# Patient Record
Sex: Female | Born: 1990 | Race: Black or African American | Hispanic: No | Marital: Married | Smoking: Never smoker
Health system: Southern US, Community
[De-identification: ages and names within clinical notes are randomized; demographics above are authoritative.]

## PROBLEM LIST (undated history)

## (undated) DIAGNOSIS — J45909 Unspecified asthma, uncomplicated: Secondary | ICD-10-CM

---

## 2015-07-02 ENCOUNTER — Ambulatory Visit (INDEPENDENT_AMBULATORY_CARE_PROVIDER_SITE_OTHER): Payer: Medicaid Other

## 2015-07-02 DIAGNOSIS — Z3490 Encounter for supervision of normal pregnancy, unspecified, unspecified trimester: Secondary | ICD-10-CM

## 2015-07-02 DIAGNOSIS — Z3201 Encounter for pregnancy test, result positive: Secondary | ICD-10-CM

## 2015-07-02 LAB — POCT PREGNANCY, URINE: Preg Test, Ur: POSITIVE — AB

## 2015-07-02 NOTE — Progress Notes (Signed)
Pt presented today for a pregnancy test. Test confirms she is currentty pregnant. Pt wishes to start care at our office.She was advised to start taking prenatal vitamins.

## 2015-07-12 ENCOUNTER — Emergency Department (HOSPITAL_COMMUNITY)
Admission: EM | Admit: 2015-07-12 | Discharge: 2015-07-12 | Disposition: A | Discharge status: Home / Self Care | Payer: Medicaid Other | Attending: Emergency Medicine | Admitting: Emergency Medicine

## 2015-07-12 ENCOUNTER — Emergency Department (HOSPITAL_COMMUNITY): Payer: Medicaid Other

## 2015-07-12 ENCOUNTER — Encounter (HOSPITAL_COMMUNITY): Payer: Self-pay | Admitting: Emergency Medicine

## 2015-07-12 DIAGNOSIS — J45909 Unspecified asthma, uncomplicated: Secondary | ICD-10-CM | POA: Insufficient documentation

## 2015-07-12 DIAGNOSIS — O26893 Other specified pregnancy related conditions, third trimester: Secondary | ICD-10-CM | POA: Insufficient documentation

## 2015-07-12 DIAGNOSIS — Y999 Unspecified external cause status: Secondary | ICD-10-CM | POA: Insufficient documentation

## 2015-07-12 DIAGNOSIS — S9032XA Contusion of left foot, initial encounter: Secondary | ICD-10-CM | POA: Diagnosis not present

## 2015-07-12 DIAGNOSIS — Y939 Activity, unspecified: Secondary | ICD-10-CM | POA: Diagnosis not present

## 2015-07-12 DIAGNOSIS — Z3A29 29 weeks gestation of pregnancy: Secondary | ICD-10-CM | POA: Diagnosis not present

## 2015-07-12 DIAGNOSIS — Y929 Unspecified place or not applicable: Secondary | ICD-10-CM | POA: Diagnosis not present

## 2015-07-12 DIAGNOSIS — W2203XA Walked into furniture, initial encounter: Secondary | ICD-10-CM | POA: Diagnosis not present

## 2015-07-12 DIAGNOSIS — M79672 Pain in left foot: Secondary | ICD-10-CM | POA: Diagnosis present

## 2015-07-12 HISTORY — DX: Unspecified asthma, uncomplicated: J45.909

## 2015-07-12 NOTE — ED Notes (Signed)
Patient transported to X-ray 

## 2015-07-12 NOTE — Discharge Instructions (Signed)
Please read and follow all provided instructions.  Your diagnoses today include:  1. Contusion of left foot, initial encounter     Tests performed today include:  An x-ray of the affected area - does NOT show any broken bones  Vital signs. See below for your results today.   Medications prescribed:  Tylenol if needed.   Take any prescribed medications only as directed.  Home care instructions:   Follow any educational materials contained in this packet  Follow R.I.C.E. Protocol:  R - rest your injury   I  - use ice on injury without applying directly to skin  C - compress injury with bandage or splint  E - elevate the injury as much as possible  Follow-up instructions: Please follow-up with your primary care provider if you continue to have significant pain in 1 week. In this case you may have a more severe injury that requires further care.   Return instructions:   Please return if your toes or feet are numb or tingling, appear gray or blue, or you have severe pain (also elevate the leg and loosen splint or wrap if you were given one)  Please return to the Emergency Department if you experience worsening symptoms.   Please return if you have any other emergent concerns.  Additional Information:  Your vital signs today were: BP 149/87 mmHg   Pulse 99   Temp(Src) 99.1 F (37.3 C) (Oral)   Resp 18   Ht 5' 3.5" (1.613 m)   Wt 130.182 kg   BMI 50.04 kg/m2   SpO2 100%   LMP  If your blood pressure (BP) was elevated above 135/85 this visit, please have this repeated by your doctor within one month. -------------- If prescribed crutches for your injury: use crutches with non-weight bearing for the first few days. Then, you may walk as the pain allows, or as instructed. Start gradually with weight bearing on the affected side. Once you can walk pain free, then try jogging. When you can run forwards, then you can try moving side-to-side. If you cannot walk without crutches in  one week, you need a re-check. --------------

## 2015-07-12 NOTE — ED Notes (Addendum)
Patient complaining of left foot pain and swelling. Patient stumped foot on coffee table. Patient states foot is swollen and in pain. Patients left foot is swollen. Patient is [redacted] weeks pregnant.

## 2015-07-12 NOTE — ED Notes (Signed)
PT REFUSED ICE PACK.

## 2015-07-12 NOTE — ED Provider Notes (Signed)
CSN: [id number]     Arrival date & time 07/12/15  2206 History  By signing my name below, I, Bridgette HabermannMaria Tan, attest that this documentation has been prepared under the direction and in the presence of Renne CriglerJoshua Jacen Carlini, PA-C. Electronically Signed: Bridgette HabermannMaria Tan, ED Scribe. 07/12/2015. 11:05 PM.   Chief Complaint  Patient presents with  . Foot Injury   The history is provided by the patient. No language interpreter was used.   HPI Comments: Monique Wilkins is a 25 y.o. female who is currently 7629 weeks pregnant presents to the Emergency Department complaining of sudden onset, constant left food pain onset this morning at 11:30. Pt notes that her foot is swollen and numb. Pt states that she struck her foot against the coffee table. Pain is exacerbated with movement and patient is not able to bear weight on the foot. Pt has not tried to alleviate the pain. Patient denies any additional injuries.  Past Medical History  Diagnosis Date  . Asthma   . Cesarean delivery delivered    History reviewed. No pertinent past surgical history. History reviewed. No pertinent family history. Social History  Substance Use Topics  . Smoking status: Never Smoker   . Smokeless tobacco: Never Used  . Alcohol Use: No   OB History    Gravida Para Term Preterm AB TAB SAB Ectopic Multiple Living   4 2 2       2      Review of Systems  Constitutional: Negative for fever and activity change.  Respiratory: Negative for shortness of breath.   Gastrointestinal: Negative for nausea and vomiting.  Musculoskeletal: Positive for arthralgias and gait problem. Negative for back pain, joint swelling and neck pain.  Skin: Negative for wound.  Neurological: Negative for weakness and numbness.   Allergies  Review of patient's allergies indicates no known allergies.  Home Medications   Prior to Admission medications   Not on File   BP 149/87 mmHg  Pulse 99  Temp(Src) 99.1 F (37.3 C) (Oral)  Resp 18  Ht 5' 3.5"  (1.613 m)  Wt 287 lb (130.182 kg)  BMI 50.04 kg/m2  SpO2 100%  LMP    Physical Exam  Constitutional: She appears well-developed and well-nourished.  HENT:  Head: Normocephalic and atraumatic.  Eyes: Conjunctivae are normal.  Neck: Normal range of motion. Neck supple.  Cardiovascular: Exam reveals no decreased pulses.   Musculoskeletal: She exhibits tenderness. She exhibits no edema.       Left ankle: Normal.       Left lower leg: Normal.       Left foot: There is tenderness. There is normal range of motion and no bony tenderness.       Feet:  Neurological: She is alert. No sensory deficit.  Motor, sensation, and vascular distal to the injury is fully intact.   Skin: Skin is warm and dry.  Psychiatric: She has a normal mood and affect.  Nursing note and vitals reviewed.   ED Course  Procedures  DIAGNOSTIC STUDIES: Oxygen Saturation is 100% on RA, normal by my interpretation.    COORDINATION OF CARE: 11:03 PM Discussed treatment plan with pt at bedside which includes crutches, ACE wrap, and pt agreed to plan.  Imaging Review Dg Foot Complete Left  07/12/2015  CLINICAL DATA:  25 year old female with left foot injury or EXAM: LEFT FOOT - COMPLETE 3+ VIEW COMPARISON:  None. FINDINGS: There is no evidence of fracture or dislocation. There is no evidence of arthropathy or other  focal bone abnormality. Soft tissues are unremarkable. IMPRESSION: Negative. Electronically Signed   By: Elgie CollardArash  Radparvar M.D.   On: 07/12/2015 23:03   I have personally reviewed and evaluated these images and lab results as part of my medical decision-making.  Vital signs reviewed and are as follows: Filed Vitals:   07/12/15 2213  BP: 149/87  Pulse: 99  Temp: 99.1 F (37.3 C)  Resp: 18   11:11 PM Patient was counseled on RICE protocol and told to rest injury, use ice for no longer than 15 minutes every hour, compress the area, and elevate above the level of their heart as much as possible to reduce  swelling. Questions answered. Patient verbalized understanding.     MDM   Final diagnoses:  Contusion of left foot, initial encounter   Contusion of foot. Imaging is negative. Foot is neurovascularly intact.   I personally performed the services described in this documentation, which was scribed in my presence. The recorded information has been reviewed and is accurate.    Renne CriglerJoshua Taleah Bellantoni, PA-C 07/12/15 2312  Leta BaptistEmily Roe Nguyen, MD 07/13/15 [phone]

## 2015-07-18 ENCOUNTER — Encounter (HOSPITAL_COMMUNITY): Payer: Self-pay | Admitting: *Deleted

## 2015-07-18 ENCOUNTER — Inpatient Hospital Stay (HOSPITAL_COMMUNITY)
Admission: AD | Admit: 2015-07-18 | Discharge: 2015-07-18 | Disposition: A | Discharge status: Home / Self Care | Payer: Medicaid Other | Source: Ambulatory Visit | Attending: Obstetrics & Gynecology | Admitting: Obstetrics & Gynecology

## 2015-07-18 ENCOUNTER — Inpatient Hospital Stay (HOSPITAL_COMMUNITY): Payer: Medicaid Other

## 2015-07-18 DIAGNOSIS — Z6841 Body Mass Index (BMI) 40.0 and over, adult: Secondary | ICD-10-CM | POA: Diagnosis not present

## 2015-07-18 DIAGNOSIS — O0990 Supervision of high risk pregnancy, unspecified, unspecified trimester: Secondary | ICD-10-CM

## 2015-07-18 DIAGNOSIS — O4693 Antepartum hemorrhage, unspecified, third trimester: Secondary | ICD-10-CM

## 2015-07-18 DIAGNOSIS — O0930 Supervision of pregnancy with insufficient antenatal care, unspecified trimester: Secondary | ICD-10-CM

## 2015-07-18 DIAGNOSIS — O34219 Maternal care for unspecified type scar from previous cesarean delivery: Secondary | ICD-10-CM | POA: Diagnosis present

## 2015-07-18 DIAGNOSIS — Z98891 History of uterine scar from previous surgery: Secondary | ICD-10-CM | POA: Diagnosis present

## 2015-07-18 DIAGNOSIS — R350 Frequency of micturition: Secondary | ICD-10-CM | POA: Diagnosis present

## 2015-07-18 DIAGNOSIS — O99213 Obesity complicating pregnancy, third trimester: Secondary | ICD-10-CM | POA: Insufficient documentation

## 2015-07-18 DIAGNOSIS — Z3A38 38 weeks gestation of pregnancy: Secondary | ICD-10-CM | POA: Diagnosis not present

## 2015-07-18 DIAGNOSIS — O0933 Supervision of pregnancy with insufficient antenatal care, third trimester: Secondary | ICD-10-CM

## 2015-07-18 DIAGNOSIS — Z3493 Encounter for supervision of normal pregnancy, unspecified, third trimester: Secondary | ICD-10-CM

## 2015-07-18 LAB — COMPREHENSIVE METABOLIC PANEL
ALBUMIN: 3.2 g/dL — AB (ref 3.5–5.0)
ALK PHOS: 114 U/L (ref 38–126)
ALT: 10 U/L — AB (ref 14–54)
AST: 13 U/L — ABNORMAL LOW (ref 15–41)
Anion gap: 10 (ref 5–15)
BUN: 11 mg/dL (ref 6–20)
CALCIUM: 9.2 mg/dL (ref 8.9–10.3)
CO2: 19 mmol/L — AB (ref 22–32)
CREATININE: 0.62 mg/dL (ref 0.44–1.00)
Chloride: 106 mmol/L (ref 101–111)
GFR calc Af Amer: >60 mL/min (ref >60)
GFR calc non Af Amer: >60 mL/min (ref >60)
GLUCOSE: 96 mg/dL (ref 65–99)
Potassium: 3.7 mmol/L (ref 3.5–5.1)
SODIUM: 135 mmol/L (ref 135–145)
Total Bilirubin: 0.6 mg/dL (ref 0.3–1.2)
Total Protein: 7.6 g/dL (ref 6.5–8.1)

## 2015-07-18 LAB — CBC
HEMATOCRIT: 31.9 % — AB (ref 36.0–46.0)
HEMOGLOBIN: 10.1 g/dL — AB (ref 12.0–15.0)
MCH: 22.1 pg — AB (ref 26.0–34.0)
MCHC: 31.7 g/dL (ref 30.0–36.0)
MCV: 69.7 fL — AB (ref 78.0–100.0)
Platelets: 216 10*3/uL (ref 150–400)
RBC: 4.58 MIL/uL (ref 3.87–5.11)
RDW: 16.7 % — ABNORMAL HIGH (ref 11.5–15.5)
WBC: 6.5 10*3/uL (ref 4.0–10.5)

## 2015-07-18 LAB — WET PREP, GENITAL
CLUE CELLS WET PREP: NONE SEEN
SPERM: NONE SEEN
Trich, Wet Prep: NONE SEEN
Yeast Wet Prep HPF POC: NONE SEEN

## 2015-07-18 LAB — RAPID URINE DRUG SCREEN, HOSP PERFORMED
AMPHETAMINES: NOT DETECTED
Barbiturates: NOT DETECTED
Benzodiazepines: NOT DETECTED
COCAINE: NOT DETECTED
OPIATES: NOT DETECTED
TETRAHYDROCANNABINOL: NOT DETECTED

## 2015-07-18 LAB — URINALYSIS, ROUTINE W REFLEX MICROSCOPIC
BILIRUBIN URINE: NEGATIVE
GLUCOSE, UA: NEGATIVE mg/dL
KETONES UR: NEGATIVE mg/dL
Nitrite: NEGATIVE
PH: 6.5 (ref 5.0–8.0)
PROTEIN: NEGATIVE mg/dL
Specific Gravity, Urine: <1.005 — ABNORMAL LOW (ref 1.005–1.030)

## 2015-07-18 LAB — TYPE AND SCREEN
ABO/RH(D): A POS
Antibody Screen: NEGATIVE

## 2015-07-18 LAB — ABO/RH: ABO/RH(D): A POS

## 2015-07-18 LAB — URINE MICROSCOPIC-ADD ON

## 2015-07-18 LAB — HEPATITIS B SURFACE ANTIGEN: Hepatitis B Surface Ag: NEGATIVE

## 2015-07-18 LAB — RPR: RPR: NONREACTIVE

## 2015-07-18 LAB — HIV ANTIBODY (ROUTINE TESTING W REFLEX): HIV Screen 4th Generation wRfx: NONREACTIVE

## 2015-07-18 NOTE — MAU Note (Addendum)
Urinating a lot last night and having a lot of pelvic pressure. Went to BR about 30mins ago and saw pink on tissue. [place].

## 2015-07-18 NOTE — Progress Notes (Signed)
Philipp DeputyKim Shaw CNM on unit and aware of pt's admission and status.

## 2015-07-18 NOTE — Progress Notes (Signed)
GBS obtained and sent.

## 2015-07-18 NOTE — MAU Provider Note (Signed)
History   [id number]   Chief Complaint  Patient presents with  . Urinary Frequency    HPI Monique Wilkins is a 25 y.o. female  G4P2002 at presumed 3645w2d IUP by uncertain LMP here with report of increased urinary frequency and pink tinge with wiping.  Also reports increased pelvic pressure.  Initial prenatal care appt scheduled with Dr. Adrian BlackwaterStinson on 07/23/15.        No LMP recorded. Patient is pregnant.  OB History  Gravida Para Term Preterm AB SAB TAB Ectopic Multiple Living  4 2 2  1 1    4     # Outcome Date GA Lbr Len/2nd Weight Sex Delivery Anes PTL Lv  4 Current           3 Term 06/03/13 2850w0d  8 lb 5 oz (3.771 kg) M CS-LTranv Spinal N Y  2 Term 06/12/11 8764w0d  7 lb 5 oz (3.317 kg) F CS-LTranv EPI N Y     Complications: Dysfunctional Labor  1 SAB               Past Medical History  Diagnosis Date  . Asthma   . Cesarean delivery delivered     No family history on file.  Social History   Social History  . Marital Status: Married    Spouse Name: N/A  . Number of Children: N/A  . Years of Education: N/A   Social History Main Topics  . Smoking status: Never Smoker   . Smokeless tobacco: Never Used  . Alcohol Use: No  . Drug Use: No  . Sexual Activity: Not on file   Other Topics Concern  . Not on file   Social History Narrative    No Known Allergies  No current facility-administered medications on file prior to encounter.   No current outpatient prescriptions on file prior to encounter.     Review of Systems  Constitutional: Negative for fever and chills.  Eyes: Negative for visual disturbance.  Gastrointestinal: Negative for abdominal pain.  Genitourinary: Positive for frequency and vaginal bleeding (spotting). Negative for dysuria, flank pain and pelvic pain.  Musculoskeletal: Positive for back pain.  Neurological: Negative for headaches.  All other systems reviewed and are negative.    Physical Exam   Filed Vitals:   07/18/15 0334  07/18/15 0516  BP: 141/72 116/73  Pulse: 89 101  Resp:  18  Height: 5' 3.5" (1.613 m)   Weight: 289 lb 12.8 oz (131.452 kg)     Physical Exam  Constitutional: She is oriented to person, place, and time. She appears well-developed and well-nourished. No distress.  HENT:  Head: Normocephalic.  Neck: Normal range of motion. Neck supple.  Cardiovascular: Normal rate, regular rhythm and normal heart sounds.   Respiratory: Effort normal and breath sounds normal. No respiratory distress.  GI: Soft. There is no tenderness.  Fundal height 37  Genitourinary: No bleeding in the vagina.  Musculoskeletal: Normal range of motion. She exhibits no edema.  Neurological: She is alert and oriented to person, place, and time.  Skin: Skin is warm and dry.   Dilation: Closed Effacement (%): Thick Cervical Position: Posterior Presentation: Vertex Exam by:: Margarita MailW. Wilkins, CNM  FHR 151, +accels Toco none  MAU Course  Procedures  MDM Ultrasound due to vaginal spotting of blood with hx of csection x 2 and  fundal height measuring 37 cm and no prior scan during pregnancy  Called by Herbert SetaHeather, recommended US change from limited to complete due to advanced  gestational age  Ultrasound; EGA 38.4 wks; nml, but limited anatomy; nml AFI; no previa or abruption identified  Assessment and Plan  25 y.o. [id number] at 6111w4d IUP Hx of Csection x 2 Reactive NST Obesity in Pregnancy No prenatal care   Plan: Discharge home Labor/Bleeding Precautions Staff will call with Csection date - Tuesday or Wednesday Prenatal labs pending   Monique Wilkins, CNM 07/18/2015 5:30 AM

## 2015-07-18 NOTE — Progress Notes (Signed)
Pt to us ambulatory

## 2015-07-18 NOTE — Progress Notes (Signed)
Margarita MailW Karim CNM in to discuss test results and discharge plan. Written and verbal d/c instructions given and understanding voiced.

## 2015-07-18 NOTE — Progress Notes (Signed)
efm reapplied when returned from u/s

## 2015-07-19 ENCOUNTER — Encounter: Payer: Self-pay | Admitting: Advanced Practice Midwife

## 2015-07-19 ENCOUNTER — Encounter (HOSPITAL_COMMUNITY): Payer: Self-pay

## 2015-07-19 ENCOUNTER — Inpatient Hospital Stay (EMERGENCY_DEPARTMENT_HOSPITAL)
Admission: AD | Admit: 2015-07-19 | Discharge: 2015-07-19 | Disposition: A | Discharge status: Home / Self Care | Payer: Medicaid Other | Source: Ambulatory Visit | Attending: Family Medicine | Admitting: Family Medicine

## 2015-07-19 DIAGNOSIS — O98313 Other infections with a predominantly sexual mode of transmission complicating pregnancy, third trimester: Secondary | ICD-10-CM | POA: Diagnosis not present

## 2015-07-19 DIAGNOSIS — O26899 Other specified pregnancy related conditions, unspecified trimester: Secondary | ICD-10-CM

## 2015-07-19 DIAGNOSIS — O34219 Maternal care for unspecified type scar from previous cesarean delivery: Secondary | ICD-10-CM

## 2015-07-19 DIAGNOSIS — A599 Trichomoniasis, unspecified: Secondary | ICD-10-CM

## 2015-07-19 DIAGNOSIS — Z3A38 38 weeks gestation of pregnancy: Secondary | ICD-10-CM

## 2015-07-19 DIAGNOSIS — R109 Unspecified abdominal pain: Secondary | ICD-10-CM

## 2015-07-19 DIAGNOSIS — O99820 Streptococcus B carrier state complicating pregnancy: Secondary | ICD-10-CM | POA: Insufficient documentation

## 2015-07-19 LAB — URINE MICROSCOPIC-ADD ON

## 2015-07-19 LAB — URINALYSIS, ROUTINE W REFLEX MICROSCOPIC
BILIRUBIN URINE: NEGATIVE
GLUCOSE, UA: NEGATIVE mg/dL
Ketones, ur: 15 mg/dL — AB
Nitrite: NEGATIVE
PH: 6.5 (ref 5.0–8.0)
Protein, ur: NEGATIVE mg/dL
SPECIFIC GRAVITY, URINE: 1.015 (ref 1.005–1.030)

## 2015-07-19 LAB — RUBELLA SCREEN

## 2015-07-19 LAB — CULTURE, BETA STREP (GROUP B ONLY)

## 2015-07-19 MED ORDER — METRONIDAZOLE 500 MG PO TABS
2000.0000 mg | ORAL_TABLET | Freq: Once | ORAL | Status: AC
  Administered 2015-07-19: 2000 mg via ORAL
  Filled 2015-07-19: qty 4

## 2015-07-19 NOTE — MAU Note (Signed)
Pelvic pressure and lower abd pain and low back pain coming and going since Saturday morning.  Couldn't sleep or nothing.  No leaking.  No bleeding. Closed last exam on Saturday. Baby moving well.

## 2015-07-19 NOTE — Discharge Instructions (Signed)
Trichomoniasis Trichomoniasis is an infection caused by an organism called Trichomonas. The infection can affect both women and men. In women, the outer female genitalia and the vagina are affected. In men, the penis is mainly affected, but the prostate and other reproductive organs can also be involved. Trichomoniasis is a sexually transmitted infection (STI) and is most often passed to another person through sexual contact.  RISK FACTORS  Having unprotected sexual intercourse.  Having sexual intercourse with an infected partner. SIGNS AND SYMPTOMS  Symptoms of trichomoniasis in women include:  Abnormal gray-green frothy vaginal discharge.  Itching and irritation of the vagina.  Itching and irritation of the area outside the vagina. Symptoms of trichomoniasis in men include:   Penile discharge with or without pain.  Pain during urination. This results from inflammation of the urethra. DIAGNOSIS  Trichomoniasis may be found during a Pap test or physical exam. Your health care provider may use one of the following methods to help diagnose this infection:  Testing the pH of the vagina with a test tape.  Using a vaginal swab test that checks for the Trichomonas organism. A test is available that provides results within a few minutes.  Examining a urine sample.  Testing vaginal secretions. Your health care provider may test you for other STIs, including HIV. TREATMENT   You may be given medicine to fight the infection. Women should inform their health care provider if they could be or are pregnant. Some medicines used to treat the infection should not be taken during pregnancy.  Your health care provider may recommend over-the-counter medicines or creams to decrease itching or irritation.  Your sexual partner will need to be treated if infected.  Your health care provider may test you for infection again 3 months after treatment. HOME CARE INSTRUCTIONS   Take medicines only as  directed by your health care provider.  Take over-the-counter medicine for itching or irritation as directed by your health care provider.  Do not have sexual intercourse while you have the infection.  Women should not douche or wear tampons while they have the infection.  Discuss your infection with your partner. Your partner may have gotten the infection from you, or you may have gotten it from your partner.  Have your sex partner get examined and treated if necessary.  Practice safe, informed, and protected sex.  See your health care provider for other STI testing. SEEK MEDICAL CARE IF:   You still have symptoms after you finish your medicine.  You develop abdominal pain.  You have pain when you urinate.  You have bleeding after sexual intercourse.  You develop a rash.  Your medicine makes you sick or makes you throw up (vomit). MAKE SURE YOU:  Understand these instructions.  Will watch your condition.  Will get help right away if you are not doing well or get worse.   This information is not intended to replace advice given to you by your health care provider. Make sure you discuss any questions you have with your health care provider.   Document Released: 06/29/2000 Document Revised: 01/24/2014 Document Reviewed: 10/15/2012 Elsevier Interactive Patient Education 2016 Else[employer] Inc[employer]  

## 2015-07-19 NOTE — MAU Provider Note (Signed)
History     CSN: [id number]  Arrival date and time: 07/19/15 [id number]   First Provider Initiated Contact with Patient 07/19/15 [phone]      Chief Complaint  Patient presents with  . Abdominal Pain   HPI [name] is a 25 y.o. [id number] at 5776w5d who presents to MAU today with complaint of pelvic pressure. The patient was seen yesterday and checked. Cervix was closed, thick yesterday. She had some spotting at that time which has resolved. She denies bleeding, discharge, LOF, regular contractions or UTI symptoms today. She has not had any prenatal care for this pregnancy. She states this is because she just got her Medicaid. She was seen by [facility] 2 weeks ago and given an estimated due date. She is supposed to be contacted for repeat C/S this week. She reports good fetal movement.   OB History    Gravida Para Term Preterm AB TAB SAB Ectopic Multiple Living   4 2 2  1  1   2       Past Medical History  Diagnosis Date  . Asthma   . Cesarean delivery delivered     Past Surgical History  Procedure Laterality Date  . Cesarean section      Family History  Problem Relation Age of Onset  . Alcohol abuse Neg Hx     Social History  Substance Use Topics  . Smoking status: Never Smoker   . Smokeless tobacco: Never Used  . Alcohol Use: No    Allergies: No Known Allergies  Prescriptions prior to admission  Medication Sig Dispense Refill Last Dose  . acetaminophen (TYLENOL) 325 MG tablet Take 650 mg by mouth every 6 (six) hours as needed.   Past Week at Unknown time    Review of Systems  Constitutional: Negative for fever and malaise/fatigue.  Gastrointestinal: Positive for abdominal pain. Negative for nausea, vomiting, diarrhea and constipation.  Genitourinary: Negative for dysuria, urgency and frequency.       Neg - vaginal bleeding, discharge, LOF   Physical Exam   Blood pressure 126/67, pulse 107, temperature 97.9 F (36.6 C), temperature source Oral, resp. rate 18,  SpO2 99 %.  Physical Exam  Nursing note and vitals reviewed. Constitutional: She is oriented to person, place, and time. She appears well-developed and well-nourished. No distress.  HENT:  Head: Normocephalic and atraumatic.  Cardiovascular: Normal rate.   Respiratory: Effort normal.  GI: Soft. She exhibits no distension and no mass. There is no tenderness. There is no rebound and no guarding.  Neurological: She is alert and oriented to person, place, and time.  Skin: Skin is warm and dry. No erythema.  Psychiatric: She has a normal mood and affect.  Dilation: Closed Effacement (%): Thick Cervical Position: Posterior Station: -3 Presentation: Undeterminable Exam by:: Remigio EisenmengerBenji Stanley RN  Results for orders placed or performed during the hospital encounter of 07/19/15 (from the past 24 hour(s))  Urinalysis, Routine w reflex microscopic (not at [facility])     Status: Abnormal   Collection Time: 07/19/15  6:00 AM  Result Value Ref Range   Color, Urine YELLOW YELLOW   APPearance CLEAR CLEAR   Specific Gravity, Urine 1.015 1.005 - 1.030   pH 6.5 5.0 - 8.0   Glucose, UA NEGATIVE NEGATIVE mg/dL   Hgb urine dipstick SMALL (A) NEGATIVE   Bilirubin Urine NEGATIVE NEGATIVE   Ketones, ur 15 (A) NEGATIVE mg/dL   Protein, ur NEGATIVE NEGATIVE mg/dL   Nitrite NEGATIVE NEGATIVE   Leukocytes, UA  SMALL (A) NEGATIVE  Urine microscopic-add on     Status: Abnormal   Collection Time: 07/19/15  6:00 AM  Result Value Ref Range   Squamous Epithelial / LPF 0-5 (A) NONE SEEN   WBC, UA 0-5 0 - 5 WBC/hpf   RBC / HPF 0-5 0 - 5 RBC/hpf   Bacteria, UA RARE (A) NONE SEEN   Trichomonas, UA PRESENT    Fetal Monitoring: Baseline: 140 bpm Variability: moderate Accelerations: 15 x 15 Decelerations: none Contractions: few, irregular with mild UI  MAU Course  Procedures None  MDM UA today shows + trichomonas 2 G Flagyl given in MAU today   Assessment and Plan  A: SIUP at  2735w5d Trichomonas  P: Discharge home Treated in MAU, partner treatment advised. Patient voiced understanding and states he will go to his MD even after expedited therapy was offered.  Labor precautions discussed Patient advised to follow-up with [facility] as planned for prenatal care/ c-section Patient may return to MAU as needed or if her condition were to change or worsen   Marny LowensteinJulie N Giancarlo Askren, PA-C  07/19/2015, 7:20 AM

## 2015-07-20 ENCOUNTER — Encounter (HOSPITAL_COMMUNITY): Payer: Self-pay | Admitting: *Deleted

## 2015-07-20 ENCOUNTER — Inpatient Hospital Stay (HOSPITAL_COMMUNITY): Payer: Medicaid Other | Admitting: Anesthesiology

## 2015-07-20 ENCOUNTER — Inpatient Hospital Stay (HOSPITAL_COMMUNITY): Payer: Medicaid Other

## 2015-07-20 ENCOUNTER — Inpatient Hospital Stay (HOSPITAL_COMMUNITY)
Admission: AD | Admit: 2015-07-20 | Discharge: 2015-07-22 | DRG: 765 | Disposition: A | Discharge status: Home / Self Care | Payer: Medicaid Other | Source: Ambulatory Visit | Attending: Obstetrics and Gynecology | Admitting: Obstetrics and Gynecology

## 2015-07-20 ENCOUNTER — Encounter (HOSPITAL_COMMUNITY)
Admission: AD | Disposition: A | Discharge status: Home / Self Care | Payer: Self-pay | Source: Ambulatory Visit | Attending: Obstetrics and Gynecology

## 2015-07-20 DIAGNOSIS — O9989 Other specified diseases and conditions complicating pregnancy, childbirth and the puerperium: Secondary | ICD-10-CM

## 2015-07-20 DIAGNOSIS — O134 Gestational [pregnancy-induced] hypertension without significant proteinuria, complicating childbirth: Secondary | ICD-10-CM | POA: Diagnosis present

## 2015-07-20 DIAGNOSIS — Z98891 History of uterine scar from previous surgery: Secondary | ICD-10-CM

## 2015-07-20 DIAGNOSIS — IMO0002 Reserved for concepts with insufficient information to code with codable children: Secondary | ICD-10-CM

## 2015-07-20 DIAGNOSIS — O0930 Supervision of pregnancy with insufficient antenatal care, unspecified trimester: Secondary | ICD-10-CM

## 2015-07-20 DIAGNOSIS — O98313 Other infections with a predominantly sexual mode of transmission complicating pregnancy, third trimester: Secondary | ICD-10-CM | POA: Diagnosis not present

## 2015-07-20 DIAGNOSIS — O34211 Maternal care for low transverse scar from previous cesarean delivery: Secondary | ICD-10-CM | POA: Diagnosis present

## 2015-07-20 DIAGNOSIS — O09899 Supervision of other high risk pregnancies, unspecified trimester: Secondary | ICD-10-CM

## 2015-07-20 DIAGNOSIS — O3413 Maternal care for benign tumor of corpus uteri, third trimester: Secondary | ICD-10-CM | POA: Diagnosis present

## 2015-07-20 DIAGNOSIS — Z6841 Body Mass Index (BMI) 40.0 and over, adult: Secondary | ICD-10-CM | POA: Diagnosis not present

## 2015-07-20 DIAGNOSIS — O139 Gestational [pregnancy-induced] hypertension without significant proteinuria, unspecified trimester: Secondary | ICD-10-CM

## 2015-07-20 DIAGNOSIS — Z3A38 38 weeks gestation of pregnancy: Secondary | ICD-10-CM

## 2015-07-20 DIAGNOSIS — Z3490 Encounter for supervision of normal pregnancy, unspecified, unspecified trimester: Secondary | ICD-10-CM

## 2015-07-20 DIAGNOSIS — O135 Gestational [pregnancy-induced] hypertension without significant proteinuria, complicating the puerperium: Secondary | ICD-10-CM

## 2015-07-20 DIAGNOSIS — Z23 Encounter for immunization: Secondary | ICD-10-CM | POA: Diagnosis not present

## 2015-07-20 DIAGNOSIS — Z8759 Personal history of other complications of pregnancy, childbirth and the puerperium: Secondary | ICD-10-CM

## 2015-07-20 DIAGNOSIS — Z283 Underimmunization status: Secondary | ICD-10-CM

## 2015-07-20 DIAGNOSIS — A599 Trichomoniasis, unspecified: Secondary | ICD-10-CM | POA: Diagnosis not present

## 2015-07-20 LAB — CBC
HEMATOCRIT: 30.7 % — AB (ref 36.0–46.0)
Hemoglobin: 9.7 g/dL — ABNORMAL LOW (ref 12.0–15.0)
MCH: 22.1 pg — ABNORMAL LOW (ref 26.0–34.0)
MCHC: 31.6 g/dL (ref 30.0–36.0)
MCV: 70.1 fL — ABNORMAL LOW (ref 78.0–100.0)
PLATELETS: 191 10*3/uL (ref 150–400)
RBC: 4.38 MIL/uL (ref 3.87–5.11)
RDW: 16.7 % — AB (ref 11.5–15.5)
WBC: 6.9 10*3/uL (ref 4.0–10.5)

## 2015-07-20 LAB — COMPREHENSIVE METABOLIC PANEL
ALBUMIN: 2.9 g/dL — AB (ref 3.5–5.0)
ALK PHOS: 100 U/L (ref 38–126)
ALT: 8 U/L — AB (ref 14–54)
AST: 12 U/L — AB (ref 15–41)
Anion gap: 8 (ref 5–15)
BILIRUBIN TOTAL: 0.7 mg/dL (ref 0.3–1.2)
BUN: 9 mg/dL (ref 6–20)
CO2: 22 mmol/L (ref 22–32)
CREATININE: 0.58 mg/dL (ref 0.44–1.00)
Calcium: 8.9 mg/dL (ref 8.9–10.3)
Chloride: 105 mmol/L (ref 101–111)
GFR calc Af Amer: >60 mL/min (ref >60)
GLUCOSE: 89 mg/dL (ref 65–99)
POTASSIUM: 4.2 mmol/L (ref 3.5–5.1)
Sodium: 135 mmol/L (ref 135–145)
TOTAL PROTEIN: 6.5 g/dL (ref 6.5–8.1)

## 2015-07-20 LAB — TYPE AND SCREEN
ABO/RH(D): A POS
Antibody Screen: NEGATIVE

## 2015-07-20 LAB — PROTEIN / CREATININE RATIO, URINE
Creatinine, Urine: 64 mg/dL
Protein Creatinine Ratio: 0.14 mg/mg{Cre} (ref 0.00–0.15)
Total Protein, Urine: 9 mg/dL

## 2015-07-20 LAB — GC/CHLAMYDIA PROBE AMP (~~LOC~~) NOT AT ARMC
Chlamydia: NEGATIVE
Neisseria Gonorrhea: NEGATIVE

## 2015-07-20 SURGERY — Surgical Case
Anesthesia: Spinal

## 2015-07-20 MED ORDER — SIMETHICONE 80 MG PO CHEW: 80.0000 mg | CHEWABLE_TABLET | ORAL | Status: DC | PRN

## 2015-07-20 MED ORDER — DIPHENHYDRAMINE HCL 50 MG/ML IJ SOLN: 12.5000 mg | INTRAMUSCULAR | Status: DC | PRN

## 2015-07-20 MED ORDER — DIPHENHYDRAMINE HCL 25 MG PO CAPS: 25.0000 mg | ORAL_CAPSULE | ORAL | Status: DC | PRN

## 2015-07-20 MED ORDER — FAMOTIDINE IN NACL 20-0.9 MG/50ML-% IV SOLN
20.0000 mg | Freq: Once | INTRAVENOUS | Status: AC
  Administered 2015-07-20: 20 mg via INTRAVENOUS
  Filled 2015-07-20: qty 50

## 2015-07-20 MED ORDER — DEXTROSE 5 % IV SOLN
3.0000 g | INTRAVENOUS | Status: AC
  Administered 2015-07-20: 3 g via INTRAVENOUS
  Filled 2015-07-20: qty 3000

## 2015-07-20 MED ORDER — SCOPOLAMINE 1 MG/3DAYS TD PT72
1.0000 | MEDICATED_PATCH | TRANSDERMAL | Status: DC
  Administered 2015-07-20: 1.5 mg via TRANSDERMAL
  Filled 2015-07-20: qty 1

## 2015-07-20 MED ORDER — NALOXONE HCL 0.4 MG/ML IJ SOLN: 0.4000 mg | INTRAMUSCULAR | Status: DC | PRN

## 2015-07-20 MED ORDER — BUPIVACAINE IN DEXTROSE 0.75-8.25 % IT SOLN
INTRATHECAL | Status: DC | PRN
  Administered 2015-07-20: 1.5 mL via INTRATHECAL

## 2015-07-20 MED ORDER — WITCH HAZEL-GLYCERIN EX PADS
1.0000 | MEDICATED_PAD | CUTANEOUS | Status: DC | PRN
Start: 2015-07-20 — End: 2015-07-22

## 2015-07-20 MED ORDER — SIMETHICONE 80 MG PO CHEW
80.0000 mg | CHEWABLE_TABLET | Freq: Three times a day (TID) | ORAL | Status: DC
  Administered 2015-07-21 – 2015-07-22 (×4): 80 mg via ORAL
  Filled 2015-07-20 (×4): qty 1

## 2015-07-20 MED ORDER — NALOXONE HCL 2 MG/2ML IJ SOSY: 1.0000 ug/kg/h | PREFILLED_SYRINGE | INTRAVENOUS | Status: DC | PRN

## 2015-07-20 MED ORDER — ONDANSETRON HCL 4 MG/2ML IJ SOLN: 4.0000 mg | Freq: Three times a day (TID) | INTRAMUSCULAR | Status: DC | PRN

## 2015-07-20 MED ORDER — MEPERIDINE HCL 25 MG/ML IJ SOLN: INTRAMUSCULAR | Status: DC | PRN

## 2015-07-20 MED ORDER — NALBUPHINE HCL 10 MG/ML IJ SOLN: 5.0000 mg | Freq: Once | INTRAMUSCULAR | Status: DC | PRN

## 2015-07-20 MED ORDER — MENTHOL 3 MG MT LOZG: 1.0000 | LOZENGE | OROMUCOSAL | Status: DC | PRN

## 2015-07-20 MED ORDER — SIMETHICONE 80 MG PO CHEW
80.0000 mg | CHEWABLE_TABLET | ORAL | Status: DC
  Administered 2015-07-20 – 2015-07-22 (×2): 80 mg via ORAL
  Filled 2015-07-20 (×2): qty 1

## 2015-07-20 MED ORDER — LACTATED RINGERS IV BOLUS (SEPSIS): 1000.0000 mL | Freq: Once | INTRAVENOUS | Status: DC

## 2015-07-20 MED ORDER — NALBUPHINE HCL 10 MG/ML IJ SOLN: 5.0000 mg | INTRAMUSCULAR | Status: DC | PRN

## 2015-07-20 MED ORDER — OXYTOCIN 10 UNIT/ML IJ SOLN
INTRAMUSCULAR | Status: AC
  Filled 2015-07-20: qty 4

## 2015-07-20 MED ORDER — LACTATED RINGERS IV BOLUS (SEPSIS)
1000.0000 mL | Freq: Once | INTRAVENOUS | Status: AC
  Administered 2015-07-20 (×2): 1000 mL via INTRAVENOUS

## 2015-07-20 MED ORDER — ONDANSETRON HCL 4 MG/2ML IJ SOLN
INTRAMUSCULAR | Status: DC | PRN
  Administered 2015-07-20: 4 mg via INTRAVENOUS

## 2015-07-20 MED ORDER — OXYTOCIN 10 UNIT/ML IJ SOLN
40.0000 [IU] | INTRAMUSCULAR | Status: DC | PRN
  Administered 2015-07-20: 40 [IU] via INTRAVENOUS

## 2015-07-20 MED ORDER — DIPHENHYDRAMINE HCL 25 MG PO CAPS: 25.0000 mg | ORAL_CAPSULE | Freq: Four times a day (QID) | ORAL | Status: DC | PRN

## 2015-07-20 MED ORDER — KETOROLAC TROMETHAMINE 30 MG/ML IJ SOLN
30.0000 mg | Freq: Four times a day (QID) | INTRAMUSCULAR | Status: AC | PRN
  Administered 2015-07-20: 30 mg via INTRAMUSCULAR

## 2015-07-20 MED ORDER — MORPHINE SULFATE (PF) 0.5 MG/ML IJ SOLN
INTRAMUSCULAR | Status: AC
  Filled 2015-07-20: qty 10

## 2015-07-20 MED ORDER — KETOROLAC TROMETHAMINE 30 MG/ML IJ SOLN
INTRAMUSCULAR | Status: AC
  Filled 2015-07-20: qty 1

## 2015-07-20 MED ORDER — LACTATED RINGERS IV SOLN
INTRAVENOUS | Status: DC | PRN
  Administered 2015-07-20: 12:00:00 via INTRAVENOUS

## 2015-07-20 MED ORDER — OXYTOCIN 40 UNITS IN LACTATED RINGERS INFUSION - SIMPLE MED: 2.5000 [IU]/h | INTRAVENOUS | Status: AC

## 2015-07-20 MED ORDER — SODIUM CHLORIDE 0.9% FLUSH: 3.0000 mL | INTRAVENOUS | Status: DC | PRN

## 2015-07-20 MED ORDER — TETANUS-DIPHTH-ACELL PERTUSSIS 5-2.5-18.5 LF-MCG/0.5 IM SUSP: 0.5000 mL | Freq: Once | INTRAMUSCULAR | Status: DC

## 2015-07-20 MED ORDER — SODIUM CHLORIDE 0.9 % IR SOLN
Status: DC | PRN
Start: 2015-07-20 — End: 2015-07-20
  Administered 2015-07-20: 1000 mL

## 2015-07-20 MED ORDER — FENTANYL CITRATE (PF) 100 MCG/2ML IJ SOLN
INTRAMUSCULAR | Status: DC | PRN
  Administered 2015-07-20: 20 ug via INTRATHECAL
  Administered 2015-07-20: 80 ug via INTRAVENOUS

## 2015-07-20 MED ORDER — FENTANYL CITRATE (PF) 100 MCG/2ML IJ SOLN
INTRAMUSCULAR | Status: AC
  Filled 2015-07-20: qty 2

## 2015-07-20 MED ORDER — LACTATED RINGERS IV SOLN: INTRAVENOUS | Status: DC

## 2015-07-20 MED ORDER — SOD CITRATE-CITRIC ACID 500-334 MG/5ML PO SOLN
30.0000 mL | Freq: Once | ORAL | Status: AC
  Administered 2015-07-20: 30 mL via ORAL
  Filled 2015-07-20: qty 15

## 2015-07-20 MED ORDER — MORPHINE SULFATE (PF) 0.5 MG/ML IJ SOLN
INTRAMUSCULAR | Status: DC | PRN
  Administered 2015-07-20: .2 mg via INTRATHECAL

## 2015-07-20 MED ORDER — SENNOSIDES-DOCUSATE SODIUM 8.6-50 MG PO TABS
2.0000 | ORAL_TABLET | ORAL | Status: DC
  Administered 2015-07-20 – 2015-07-22 (×2): 2 via ORAL
  Filled 2015-07-20 (×2): qty 2

## 2015-07-20 MED ORDER — LACTATED RINGERS IV SOLN
INTRAVENOUS | Status: DC
  Administered 2015-07-20 (×2): via INTRAVENOUS

## 2015-07-20 MED ORDER — KETOROLAC TROMETHAMINE 30 MG/ML IJ SOLN: 30.0000 mg | Freq: Four times a day (QID) | INTRAMUSCULAR | Status: AC | PRN

## 2015-07-20 MED ORDER — PHENYLEPHRINE HCL 10 MG/ML IJ SOLN
INTRAMUSCULAR | Status: DC | PRN
  Administered 2015-07-20: 40 ug via INTRAVENOUS

## 2015-07-20 MED ORDER — OXYCODONE HCL 5 MG PO TABS
5.0000 mg | ORAL_TABLET | ORAL | Status: DC | PRN
  Administered 2015-07-21 – 2015-07-22 (×4): 5 mg via ORAL
  Filled 2015-07-20 (×3): qty 1

## 2015-07-20 MED ORDER — COCONUT OIL OIL
1.0000 | TOPICAL_OIL | Status: DC | PRN
Start: 2015-07-20 — End: 2015-07-22

## 2015-07-20 MED ORDER — DIBUCAINE 1 % RE OINT: 1.0000 "application " | TOPICAL_OINTMENT | RECTAL | Status: DC | PRN

## 2015-07-20 MED ORDER — OXYCODONE HCL 5 MG PO TABS
10.0000 mg | ORAL_TABLET | ORAL | Status: DC | PRN
  Filled 2015-07-20: qty 2

## 2015-07-20 MED ORDER — MEPERIDINE HCL 25 MG/ML IJ SOLN: 6.2500 mg | INTRAMUSCULAR | Status: DC | PRN

## 2015-07-20 MED ORDER — PHENYLEPHRINE 8 MG IN D5W 100 ML (0.08MG/ML) PREMIX OPTIME
INJECTION | INTRAVENOUS | Status: DC | PRN
  Administered 2015-07-20: 60 ug/min via INTRAVENOUS

## 2015-07-20 MED ORDER — PRENATAL MULTIVITAMIN CH
1.0000 | ORAL_TABLET | Freq: Every day | ORAL | Status: DC
  Administered 2015-07-21 – 2015-07-22 (×2): 1 via ORAL
  Filled 2015-07-20 (×2): qty 1

## 2015-07-20 MED ORDER — ONDANSETRON HCL 4 MG/2ML IJ SOLN
INTRAMUSCULAR | Status: AC
  Filled 2015-07-20: qty 2

## 2015-07-20 MED ORDER — ACETAMINOPHEN 325 MG PO TABS
650.0000 mg | ORAL_TABLET | ORAL | Status: DC | PRN
  Administered 2015-07-21 (×2): 650 mg via ORAL
  Filled 2015-07-20 (×2): qty 2

## 2015-07-20 MED ORDER — IBUPROFEN 600 MG PO TABS: 600.0000 mg | ORAL_TABLET | Freq: Four times a day (QID) | ORAL | Status: DC | PRN

## 2015-07-20 MED ORDER — FENTANYL CITRATE (PF) 100 MCG/2ML IJ SOLN: 25.0000 ug | INTRAMUSCULAR | Status: DC | PRN

## 2015-07-20 MED ORDER — ZOLPIDEM TARTRATE 5 MG PO TABS: 5.0000 mg | ORAL_TABLET | Freq: Every evening | ORAL | Status: DC | PRN

## 2015-07-20 MED ORDER — IBUPROFEN 600 MG PO TABS
600.0000 mg | ORAL_TABLET | Freq: Four times a day (QID) | ORAL | Status: DC
  Administered 2015-07-20 – 2015-07-22 (×7): 600 mg via ORAL
  Filled 2015-07-20 (×7): qty 1

## 2015-07-20 MED ORDER — PHENYLEPHRINE 8 MG IN D5W 100 ML (0.08MG/ML) PREMIX OPTIME
INJECTION | INTRAVENOUS | Status: AC
  Filled 2015-07-20: qty 100

## 2015-07-20 SURGICAL SUPPLY — 36 items
BARRIER ADHS 3X4 INTERCEED (GAUZE/BANDAGES/DRESSINGS) IMPLANT
BENZOIN TINCTURE PRP APPL 2/3 (GAUZE/BANDAGES/DRESSINGS) ×3 IMPLANT
CLAMP CORD UMBIL (MISCELLANEOUS) IMPLANT
CLOSURE WOUND 1/2 X4 (GAUZE/BANDAGES/DRESSINGS) ×1
CLOTH BEACON ORANGE TIMEOUT ST (SAFETY) ×3 IMPLANT
DRSG OPSITE POSTOP 4X10 (GAUZE/BANDAGES/DRESSINGS) ×3 IMPLANT
DURAPREP 26ML APPLICATOR (WOUND CARE) ×3 IMPLANT
ELECT REM PT RETURN 9FT ADLT (ELECTROSURGICAL) ×3
ELECTRODE REM PT RTRN 9FT ADLT (ELECTROSURGICAL) ×1 IMPLANT
EXTRACTOR VACUUM KIWI (MISCELLANEOUS) IMPLANT
GLOVE BIO SURGEON STRL SZ 6.5 (GLOVE) ×2 IMPLANT
GLOVE BIO SURGEONS STRL SZ 6.5 (GLOVE) ×1
GLOVE BIOGEL PI IND STRL 7.0 (GLOVE) ×2 IMPLANT
GLOVE BIOGEL PI INDICATOR 7.0 (GLOVE) ×4
GOWN STRL REUS W/TWL LRG LVL3 (GOWN DISPOSABLE) ×6 IMPLANT
KIT ABG SYR 3ML LUER SLIP (SYRINGE) IMPLANT
NEEDLE HYPO 22GX1.5 SAFETY (NEEDLE) IMPLANT
NEEDLE HYPO 25X5/8 SAFETYGLIDE (NEEDLE) IMPLANT
NS IRRIG 1000ML POUR BTL (IV SOLUTION) ×3 IMPLANT
PACK C SECTION WH (CUSTOM PROCEDURE TRAY) ×3 IMPLANT
PAD ABD 7.5X8 STRL (GAUZE/BANDAGES/DRESSINGS) ×3 IMPLANT
PAD OB MATERNITY 4.3X12.25 (PERSONAL CARE ITEMS) ×3 IMPLANT
PENCIL SMOKE EVAC W/HOLSTER (ELECTROSURGICAL) ×3 IMPLANT
RETRACTOR WND ALEXIS 25 LRG (MISCELLANEOUS) IMPLANT
RTRCTR WOUND ALEXIS 25CM LRG (MISCELLANEOUS)
SPONGE GAUZE 4X4 12PLY (GAUZE/BANDAGES/DRESSINGS) ×6 IMPLANT
SPONGE GAUZE 4X4 12PLY STER LF (GAUZE/BANDAGES/DRESSINGS) ×6 IMPLANT
STRIP CLOSURE SKIN 1/2X4 (GAUZE/BANDAGES/DRESSINGS) ×2 IMPLANT
SUT VIC AB 0 CT1 36 (SUTURE) ×18 IMPLANT
SUT VIC AB 2-0 CT1 27 (SUTURE) ×2
SUT VIC AB 2-0 CT1 TAPERPNT 27 (SUTURE) ×1 IMPLANT
SUT VIC AB 4-0 KS 27 (SUTURE) ×3 IMPLANT
SUT VIC AB 4-0 PS2 27 (SUTURE) ×3 IMPLANT
SYR CONTROL 10ML LL (SYRINGE) IMPLANT
TOWEL OR 17X24 6PK STRL BLUE (TOWEL DISPOSABLE) ×3 IMPLANT
TRAY FOLEY CATH SILVER 14FR (SET/KITS/TRAYS/PACK) IMPLANT

## 2015-07-20 NOTE — Anesthesia Postprocedure Evaluation (Signed)
Anesthesia Post Note  Patient: Monique Wilkins  Procedure(s) Performed: Procedure(s) (LRB): CESAREAN SECTION (N/A)  Patient location during evaluation: PACU Anesthesia Type: Combined Spinal/Epidural Level of consciousness: awake and alert and oriented Pain management: pain level controlled Vital Signs Assessment: post-procedure vital signs reviewed and stable Respiratory status: spontaneous breathing, nonlabored ventilation and respiratory function stable Cardiovascular status: blood pressure returned to baseline and stable Postop Assessment: no signs of nausea or vomiting, no headache, no backache, spinal receding and patient able to bend at knees Anesthetic complications: no     Last Vitals:  Filed Vitals:   07/20/15 1416 07/20/15 1427  BP: 117/51 116/47  Pulse: 104 72  Temp: 36.3 C 36.4 C  Resp: 23 18    Last Pain:  Filed Vitals:   07/20/15 1428  PainSc: 0-No pain   Pain Goal: Patients Stated Pain Goal: 0 (07/20/15 0258)               [name] A.

## 2015-07-20 NOTE — Op Note (Signed)
Cesarean Section Operative Report  Krisandra Wilkins  07/20/2015  Indications: History cesarean section x 2. Gestational hypertension at term  Pre-operative Diagnosis: History cesarean section x 2. Gestational hypertension. Scant prenatal care. BMI 48. Fibroid uterus.   Post-operative Diagnosis: Same. Delivered  Surgeon: Surgeon(s) and Role:    * Numidia Bingharlie Jasia Hiltunen, MD - Primary    * Kathrynn RunningNoah Bedford Wouk, MD - Resident - Assisting   Attending Attestation: I was present and scrubbed for the entire procedure.   Assistants: none  Anesthesia: spinal    Estimated Blood Loss: 500 ml  Total IV Fluids: 800 ml LR  Antibiotics: Ancef 3gm  VTE Prophylaxis: SCDs to bilateral lower extremities  Urine Output:: 50 ml clear yellow urine  Specimens: none  Findings: Viable female infant in cephalic presentation; APGARs 8/9; weight 3720 g; arterial cord pH not obtained; clear amniotic fluid; intact placenta with three vessel cord; normal uterus except for 3-4cm posterior mid fundal to near the lower uterine segment intramural fibroid (which would likely not present any difficulty with hysterectomy), fallopian tubes and ovaries bilaterally. Surprisingly, no significant adhesive disease.  Baby condition / location:  Couplet care / Skin to Skin   Complications: no complications  Indications: Titus DubinCherrell Wilkins is a 25 y.o. [id number] with an IUP 3733w6d presenting with no prenatal care, gestational hypertension, and u/s showing her to be term.  The risks, benefits, complications, treatment options, and exected outcomes were discussed with the patient . The patient dwith the proposed plan, giving informed consent. identified as Monique Wilkins and the procedure verified as C-Section Delivery.  Procedure Details:  The patient was taken back to the operative suite where spinal anesthesia was placed.  A time out was held and the above information confirmed.   After induction of  anesthesia, the patient was draped and prepped in the usual sterile manner and placed in a dorsal supine position with a leftward tilt. A Pfannenstiel incision was made and carried down through the subcutaneous tissue to the fascia. Fascial incision was made and sharply extended transversely. The fascia was separated from the underlying rectus tissue superiorly and inferiorly. The peritoneum was identified and bluntly entered and extended longitudinally. Bladder blade placed A low transverse uterine incision was made and extended bluntly. Delivered from cephalic presentation was a viable infant with Apgars and weight as above.  After waiting 60 seconds for delayed cord cutting, the umbilical cord was clamped and cut cord blood was obtained for evaluation. Cord ph was not sent. The placenta was removed Intact and appeared normal. The uterine outline, tubes and ovaries appeared normal. The uterus was exteriorized.The uterine incision was closed with running locked sutures of 0 monocryl with an imbricating layer of the same.   Hemostasis was observed. The peritoneum was closed with 3-0 vicryl. The rectus muscles were examined and hemostasis observed. The fascia was then reapproximated with running sutures of 0Vicryl.  The subcuticular closure was performed using 2-0plain gut. The skin was closed with 4-0Vicryl.   Instrument, sponge, and needle counts were correct prior the abdominal closure and were correct at the conclusion of the case.     Disposition: PACU - hemodynamically stable.   Maternal Condition: stable       Signed: Cherrie Gauzeoah B WoukMD 07/20/2015 1:08 PM   Agree with above. I was present and scrubbed for the entire procedure.   Cornelia Copaharlie Chastity Noland, Jr MD Attending Center for [facility] Mid[facility](Faculty Practice)

## 2015-07-20 NOTE — Transfer of Care (Signed)
Immediate Anesthesia Transfer of Care Note  Patient: Monique Wilkins  Procedure(s) Performed: Procedure(s): CESAREAN SECTION (N/A)  Patient Location: PACU  Anesthesia Type:Spinal  Level of Consciousness: awake, alert  and oriented  Airway & Oxygen Therapy: Patient Spontanous Breathing  Post-op Assessment: Report given to RN and Post -op Vital signs reviewed and stable  Post vital signs: Reviewed and stable  Last Vitals:  Filed Vitals:   07/20/15 0803 07/20/15 1012  BP: 117/56 122/58  Pulse: 78 93  Temp: 36.8 C   Resp: 18 18    Last Pain:  Filed Vitals:   07/20/15 1230  PainSc: 9       Patients Stated Pain Goal: 0 (07/20/15 0258)  Complications: No apparent anesthesia complications

## 2015-07-20 NOTE — Anesthesia Preprocedure Evaluation (Signed)
Anesthesia Evaluation  Patient identified by MRN, date of birth, ID band Patient awake    Reviewed: Allergy & Precautions, NPO status , Patient's Chart, lab work & pertinent test results  Airway Mallampati: III  TM Distance: >3 FB Neck ROM: Full    Dental no notable dental hx. (+) Teeth Intact   Pulmonary asthma ,    Pulmonary exam normal breath sounds clear to auscultation       Cardiovascular negative cardio ROS Normal cardiovascular exam Rhythm:Regular Rate:Normal     Neuro/Psych negative neurological ROS  negative psych ROS   GI/Hepatic negative GI ROS, Neg liver ROS,   Endo/Other  Morbid obesity  Renal/GU negative Renal ROS  negative genitourinary   Musculoskeletal negative musculoskeletal ROS (+)   Abdominal (+) + obese,   Peds  Hematology  (+) anemia ,   Anesthesia Other Findings   Reproductive/Obstetrics (+) Pregnancy Previous C/Section x2                              Anesthesia Physical Anesthesia Plan  ASA: III and emergent  Anesthesia Plan: Combined Spinal and Epidural   Post-op Pain Management:    Induction:   Airway Management Planned: Natural Airway and Nasal Cannula  Additional Equipment:   Intra-op Plan:   Post-operative Plan:   Informed Consent: I have reviewed the patients History and Physical, chart, labs and discussed the procedure including the risks, benefits and alternatives for the proposed anesthesia with the patient or authorized representative who has indicated his/her understanding and acceptance.   Dental advisory given  Plan Discussed with: CRNA, Anesthesiologist and Surgeon  Anesthesia Plan Comments:         Anesthesia Quick Evaluation

## 2015-07-20 NOTE — MAU Provider Note (Signed)
  History     CSN: [id number]  Arrival date and time: 07/20/15 0244   First Provider Initiated Contact with Patient 07/20/15 0505      Chief Complaint  Patient presents with  . Labor Eval   HPI [name] presented to the MAU to pelvic pressure q 30 minutes for the past day. She was initially here for RN Labor eval; her cervix is closed per nursing. During observation, FHT had one prolonged deceleration for 4 minutes with nadir HR in 70s. Therefore a BPP was ordered; BPP 8/8.  Of note, patient has no prenatal care. She had initial prenatal labs drawn on 7/1 during MAU visit for pelvic pressure. At this visit she was diagnosed with Trichomonas and was treated in MAU.   During observation, patient noted to have elevated BP 147/76 which normalized on repeat. Of note, she had elevated BP recorded on 7/1 at 141/72 and on 6/25 (149/87). Patient denies HA, visual changes, extremity swelling, RUQ/epigastric tenderness.   Denies diagnosis of HTN. No other PMH. No medications. No allergies.  Had C-section for failure to progress.   OB History    Gravida Para Term Preterm AB TAB SAB Ectopic Multiple Living   4 2 2  1  1   2       Past Medical History  Diagnosis Date  . Asthma   . Cesarean delivery delivered     Past Surgical History  Procedure Laterality Date  . Cesarean section      Family History  Problem Relation Age of Onset  . Alcohol abuse Neg Hx     Social History  Substance Use Topics  . Smoking status: Never Smoker   . Smokeless tobacco: Never Used  . Alcohol Use: No    Allergies: No Known Allergies  Prescriptions prior to admission  Medication Sig Dispense Refill Last Dose  . acetaminophen (TYLENOL) 325 MG tablet Take 650 mg by mouth every 6 (six) hours as needed.   Past Week at Unknown time    ROS Physical Exam   Blood pressure 109/58, pulse 80, temperature 97.6 F (36.4 C), temperature source Oral, resp. rate 24, height 5' 3.5" (1.613 m), weight  124.938 kg (275 lb 7 oz).  Physical Exam GEN: NAD CV: RRR, no murmurs, rubs, or gallops PULM: CTAB, normal effort ABD: Soft, nontender, gravid SKIN: No rash or cyanosis; warm and well-perfused EXTR: No lower extremity edema or calf tenderness NEURO: Awake, alert, no focal deficits grossly, normal speech  MAU Course  Procedures  MDM 25yo [id number] at 5365w6d by 38w US. No PNC.  38w US: 3634g 87%tile EFW Due to > 2 elevated blood pressure readings, will obtain pre-e labs. This may be chronic HTN.  Discussed with Dr. Penne LashLeggett. Will await on labs and discuss with AM team.   CBC with hgb 9.7, CMP unremarkable. Urine Pr/Cr ratio 0.14  Assessment and Plan  Dr. Anastasio ChampionPicket to evaluate patient for possible C-section for gestational HTN.   Palma HolterKanishka G Gunadasa 07/20/2015, 5:21 AM

## 2015-07-20 NOTE — MAU Note (Signed)
Patient presents at [redacted] weeks gestation with c/o contractions every 20-30 minutes since 0945 today. Fetus active. Denies bleeding or discharge.

## 2015-07-20 NOTE — Addendum Note (Signed)
Addendum  created 07/20/15 1732 by Algis GreenhouseLinda A Damarys Speir, CRNA   Modules edited: Clinical Notes   Clinical Notes:  File: [id number]

## 2015-07-20 NOTE — Anesthesia Postprocedure Evaluation (Signed)
Anesthesia Post Note  Patient: Monique Wilkins  Procedure(s) Performed: Procedure(s) (LRB): CESAREAN SECTION (N/A)  Patient location during evaluation: Mother Baby Anesthesia Type: Epidural Level of consciousness: awake Pain management: satisfactory to patient Vital Signs Assessment: post-procedure vital signs reviewed and stable Respiratory status: spontaneous breathing Cardiovascular status: stable Anesthetic complications: no     Last Vitals:  Filed Vitals:   07/20/15 1530 07/20/15 1630  BP: 118/57 112/61  Pulse: 73 81  Temp: 36.8 C 37 C  Resp: 18 18    Last Pain:  Filed Vitals:   07/20/15 1654  PainSc: 0-No pain   Pain Goal: Patients Stated Pain Goal: 0 (07/20/15 0258)               Cephus ShellingBURGER,Pansie Guggisberg

## 2015-07-20 NOTE — Anesthesia Procedure Notes (Addendum)
Spinal Patient location during procedure: OR Start time: 07/20/2015 11:46 AM Staffing Anesthesiologist: Mal AmabileFOSTER, Carrina Schoenberger Performed by: anesthesiologist  Preanesthetic Checklist Completed: patient identified, site marked, surgical consent, pre-op evaluation, timeout performed, IV checked, risks and benefits discussed and monitors and equipment checked Spinal Block Patient position: sitting Prep: site prepped and draped and DuraPrep Patient monitoring: cardiac monitor, continuous pulse ox, blood pressure and heart rate Approach: midline Location: L3-4 Injection technique: catheter Needle Needle type: Tuohy and Spinocan  Needle gauge: 24 G Needle length: 12.7 cm Needle insertion depth: 9 cm Catheter type: closed end flexible Catheter size: 19 g Catheter at skin depth: 14 cm Assessment Sensory level: T4 Additional Notes Epidural performed with 17ga Touhy needle LOR with air. SAB performed through the epidural needle. CSF clear, free flow, no paresthesias. LA+ Narcotics injected. Spinal needle withdrawn and epidural catheter threaded 5cm into the epidural space. Epidural needle then withdrawn and sterile dressing applied. Patient placed supine with LUD. Patient tolerated the procedure well. Adequate sensory level.

## 2015-07-20 NOTE — MAU Note (Signed)
Percell BostonHeather Waken asked to perform BPP ultrasound at bedside.

## 2015-07-20 NOTE — H&P (Signed)
Attestation signed by Kirkpatrick Bingharlie Pickens, MD at 07/20/2015 9:15 AM  H&P reviewed and patient examined and agree with above. [id number] @ 38/6 with LPNC, obesity and now gHTN @ term. D/w her recommendation for delivery since term and she is amenable to this. Pre-eclampisa w/u negative and ROS negative. Fetus category I. NPO since 2000-2100 yesterday. D/w her increased risks of complications due to weight and two prior c-sections. Can proceed later this morning/early afternoon since mom and fetus are stable.  Monique Wilkins, Jr MD Attending Center for [facility] ([facility]ulty [facility])      Expand All Collapse All    History     CSN: [id number]  Arrival date and time: 07/20/15 0244  First Provider Initiated Contact with Patient 07/20/15 0505    Chief Complaint  Patient presents with  . Labor Eval   HPI Monique Wilkins presented to the MAU to pelvic pressure q 30 minutes for the past day. She was initially here for RN Labor eval; her cervix is closed per nursing. During observation, FHT had one prolonged deceleration for 4 minutes with nadir HR in 70s. Therefore a BPP was ordered; BPP 8/8.  Of note, patient has no prenatal care. She had initial prenatal labs drawn on 7/1 during MAU visit for pelvic pressure. At this visit she was diagnosed with Trichomonas and was treated in MAU.   During observation, patient noted to have elevated BP 147/76 which normalized on repeat. Of note, she had elevated BP recorded on 7/1 at 141/72 and on 6/25 (149/87). Patient denies HA, visual changes, extremity swelling, RUQ/epigastric tenderness.   Denies diagnosis of HTN. No other PMH. No medications. No allergies.  Had C-section for failure to progress.   OB History    Gravida Para Term Preterm AB TAB SAB Ectopic Multiple Living   4 2 2  1  1   2       Past Medical History  Diagnosis Date  . Asthma   . Cesarean delivery delivered     Past  Surgical History  Procedure Laterality Date  . Cesarean section      Family History  Problem Relation Age of Onset  . Alcohol abuse Neg Hx     Social History  Substance Use Topics  . Smoking status: Never Smoker   . Smokeless tobacco: Never Used  . Alcohol Use: No    Allergies: No Known Allergies  Prescriptions prior to admission  Medication Sig Dispense Refill Last Dose  . acetaminophen (TYLENOL) 325 MG tablet Take 650 mg by mouth every 6 (six) hours as needed.   Past Week at Unknown time    ROS Physical Exam   Blood pressure 109/58, pulse 80, temperature 97.6 F (36.4 C), temperature source Oral, resp. rate 24, height 5' 3.5" (1.613 m), weight 124.938 kg (275 lb 7 oz).  Physical Exam GEN: NAD CV: RRR, no murmurs, rubs, or gallops PULM: CTAB, normal effort ABD: Soft, nontender, gravid SKIN: No rash or cyanosis; warm and well-perfused EXTR: No lower extremity edema or calf tenderness NEURO: Awake, alert, no focal deficits grossly, normal speech  MAU Course  Procedures  MDM 24yo [id number] at 9640w6d by 38w US. No PNC.  38w US: 3634g 87%tile EFW Due to > 2 elevated blood pressure readings, will obtain pre-e labs. This may be chronic HTN.  Discussed with Dr. Penne LashLeggett. Will await on labs and discuss with AM team.   CBC with hgb 9.7, CMP unremarkable. Urine Pr/Cr ratio 0.14  Assessment and Plan  Dr. Anastasio ChampionPicket to  evaluate patient for possible C-section for gestational HTN.   Palma HolterKanishka G Gunadasa 07/20/2015, 5:21 AM         Patient reviewed by Drs Penne LashLeggett and Vergie LivingPickens Decision made to proceed with Cesarean Section due to diagnosis or gestational hypertension MD to follow Aviva SignsMarie L Alsie Younes, CNM

## 2015-07-21 LAB — CBC
HCT: 28.1 % — ABNORMAL LOW (ref 36.0–46.0)
Hemoglobin: 9 g/dL — ABNORMAL LOW (ref 12.0–15.0)
MCH: 22.2 pg — ABNORMAL LOW (ref 26.0–34.0)
MCHC: 32 g/dL (ref 30.0–36.0)
MCV: 69.4 fL — ABNORMAL LOW (ref 78.0–100.0)
PLATELETS: 177 10*3/uL (ref 150–400)
RBC: 4.05 MIL/uL (ref 3.87–5.11)
RDW: 16.8 % — AB (ref 11.5–15.5)
WBC: 8.1 10*3/uL (ref 4.0–10.5)

## 2015-07-21 MED ORDER — MEASLES, MUMPS & RUBELLA VAC ~~LOC~~ INJ
0.5000 mL | INJECTION | Freq: Once | SUBCUTANEOUS | Status: AC
  Administered 2015-07-22: 0.5 mL via SUBCUTANEOUS
  Filled 2015-07-21 (×2): qty 0.5

## 2015-07-21 NOTE — Progress Notes (Signed)
UR chart review completed.  

## 2015-07-21 NOTE — Clinical Social Work Maternal (Signed)
  CLINICAL SOCIAL WORK MATERNAL/CHILD NOTE  Patient Details  Name: Monique Wilkins MRN: [id number] Date of Birth: 1990/11/09  Date:  07/21/2015  Clinical Social Worker Initiating Note:  Laurey Arrow Date/ Time Initiated:  07/21/15/1040     Child's Name:  Epifania Gore   Legal Guardian:  Mother   Need for Interpreter:  None   Date of Referral:  07/21/15     Reason for Referral:      Referral Source:  [facility]   Address:  [place] [address]  Phone number:  [phone]   Household Members:  Self, Minor Children, Spouse   Natural Supports (not living in the home):  Spouse/significant other   Professional Supports: None   Employment:     Type of Work:     Education:  Database administrator Resources:  Medicaid   Other Resources:      Cultural/Religious Considerations Which May Impact Care:  None Reported  Strengths:  Ability to meet basic needs , Home prepared for child , Pediatrician chosen    Risk Factors/Current Problems:  Other (Comment) [facility])   Cognitive State:  Alert , Insightful    Mood/Affect:  Interested , Relaxed , Calm , Comfortable    CSW Assessment: CSW met with MOB to complete an assessment for a consult for [facility].  MOB was inviting, calm, and interested with speaking with CSW.  MOB introduced her room guest as her husband/FOB [employer]) and gave CSW permission to speak with MOB while FOB was visiting.  FOB did not engage with CSW, however, he was very attentive and appropriate with infant.  CSW inquired about MOB's LPNC, and MOB disclosed that she recently move her from [place] and had a difficulty with obtaining Medicaid.  MOB stated that she attempted to see a physician; however, because she did not have insurance she was not successful. CSW informed MOB of the hospital's drug screen policy, and informed MOB and FOB of the 2 screenings for the infant.  MOB was understanding and denied utilizing any  substance during pregnancy.   CSW explained that CSW will monitor's the infant's UDS and Cord, and will make a report to CPS if needed.  MOB stated she was not concerned, and did not have any questions about the hospital's policy. CSW educated MOB about PPD. CSW informed MOB of possible supports and interventions to decrease PPD.  CSW also encouraged MOB to seek medical attention if needed for increased signs and symptoms for PPD. CSW reviewed safe sleep, and SIDS. MOB was knowledgeable and asked appropriate questions.  MOB communicated that she has a pack n play for the baby, and feels prepared for the infant.  MOB did not have any further questions, concerns, or needs at this time.  CSW Plan/Description:  Patient/Family Education , No Further Intervention Required/No Barriers to Discharge (CSW will monitor UDS and cord; if warranted CSW will make a report to CPS)    Romel Dumond D BOYD-GILYARD, LCSW 07/21/2015, 10:43 AM

## 2015-07-21 NOTE — Progress Notes (Addendum)
Post Partum Day 1  Subjective:  Monique Wilkins is a 25 y.o. [id number] 713w6d s/p repeat LTCS.  No acute events overnight.  Pt denies problems with ambulating, voiding or po intake.  She denies nausea or vomiting.  Pain is well controlled.  She has had flatus. She has not had bowel movement.  Lochia Small.  Plan for birth control is bilateral tubal ligation.  Method of Feeding: breastfeeding, going well. Counseled pt on normal frequency of newborn feedings (12-15 times per day), importance of drinking water, lactation support groups.  Objective: BP 114/69 mmHg  Pulse 91  Temp(Src) 98.1 F (36.7 C) (Oral)  Resp 18  Ht 5' 3.5" (1.613 m)  Wt 124.938 kg (275 lb 7 oz)  BMI 48.02 kg/m2  SpO2 99%  Breastfeeding? Unknown  Physical Exam:  General: alert, cooperative and no distress Lochia:normal flow Chest: CTAB Heart: RRR no m/r/g Abdomen: +BS, soft, nontender, fundus firm at/below umbilicus Uterine Fundus: firm DVT Evaluation: No evidence of DVT seen on physical exam. Extremities: no edema   Recent Labs  07/20/15 0508 07/21/15 0508  HGB 9.7* 9.0*  HCT 30.7* 28.1*    Assessment/Plan:  ASSESSMENT: Monique Wilkins is a 25 y.o. [id number] 2513w6d ppd #1 s/p rLTCS doing well.   Plan for discharge tomorrow and Breastfeeding   LOS: 1 day   Loni MuseKate Timberlake 07/21/2015, 7:17 AM   CNM attestation Post Partum Day #1 I have seen and examined this patient and agree with above documentation in the resident's note.   Monique Wilkins is a 25 y.o. [id number] s/p rLTCS.  Pt denies problems with ambulating, voiding or po intake. Pain is well controlled.  Plan for birth control is bilateral tubal ligation.  Method of Feeding: breast  PE:  BP 118/61 mmHg  Pulse 103  Temp(Src) 98.8 F (37.1 C) (Oral)  Resp 24  Ht 5' 3.5" (1.613 m)  Wt 124.938 kg (275 lb 7 oz)  BMI 48.02 kg/m2  SpO2 100%  Breastfeeding? Unknown Fundus firm  Plan for discharge: 07/22/15 [facility] SW consult for no prenatal care  Cam HaiSHAW, KIMBERLY, CNM 9:20 AM  07/21/2015

## 2015-07-21 NOTE — Lactation Note (Signed)
This note was copied from a baby's chart. Lactation Consultation Note  Though this is mother's 3rd child it is her first breastfeeding experience.  She reports that feedings are going well and denies any questions.  Hand expression taught with colostrum easily expressed. Follow-up tomorrow or sooner if needed. Aware of support groups and outpatient services.  Patient Name: Monique Titus DubinCherrell Wilkins Today's Date: 07/21/2015 Reason for consult: Initial assessment   Maternal Data Has patient been taught Hand Expression?: Yes  Feeding Length of feed: 25 min  LATCH Score/Interventions Latch: Repeated attempts needed to sustain latch, nipple held in mouth throughout feeding, stimulation needed to elicit sucking reflex.  Audible Swallowing: A few with stimulation Intervention(s): Skin to skin  Type of Nipple: Everted at rest and after stimulation  Comfort (Breast/Nipple): Soft / non-tender     Hold (Positioning): Assistance needed to correctly position infant at breast and maintain latch.  LATCH Score: 7  Lactation Tools Discussed/Used     Consult Status Consult Status: Follow-up    Soyla DryerJoseph, Tavien Chestnut 07/21/2015, 12:33 PM

## 2015-07-22 DIAGNOSIS — O139 Gestational [pregnancy-induced] hypertension without significant proteinuria, unspecified trimester: Secondary | ICD-10-CM

## 2015-07-22 DIAGNOSIS — Z8759 Personal history of other complications of pregnancy, childbirth and the puerperium: Secondary | ICD-10-CM

## 2015-07-22 MED ORDER — PRENATAL MULTIVITAMIN CH: 1.0000 | ORAL_TABLET | Freq: Every day | ORAL | Status: DC

## 2015-07-22 MED ORDER — IBUPROFEN 600 MG PO TABS: 600.0000 mg | ORAL_TABLET | Freq: Four times a day (QID) | ORAL | Status: DC

## 2015-07-22 MED ORDER — OXYCODONE HCL 5 MG PO TABS: 5.0000 mg | ORAL_TABLET | ORAL | Status: DC | PRN

## 2015-07-22 MED ORDER — SENNOSIDES-DOCUSATE SODIUM 8.6-50 MG PO TABS: 1.0000 | ORAL_TABLET | Freq: Every evening | ORAL | Status: DC | PRN

## 2015-07-22 NOTE — Discharge Summary (Signed)
OB Discharge Summary     Patient Name: Monique DubinCherrell Wilkins DOB: 10/17/1990 MRN: [id number]  Date of admission: 07/20/2015 Delivering MD: [place] BingPICKENS, CHARLIE   Date of discharge: 07/22/2015  Admitting diagnosis: 39 WEEKS PELVIC PAIN EVERY Q 20-30 MIN PREVIOUS CESAREAN SECTION Intrauterine pregnancy: 4365w6d     Secondary diagnosis:  Active Problems:   Rubella non-immune status, antepartum   Status post repeat low transverse cesarean section   Gestational hypertension  Additional problems: none     Discharge diagnosis: Term Pregnancy Delivered                                                                                                Post partum procedures:none  Augmentation: n/a  Complications: None  Hospital course:  Sceduled C/S   25 y.o. yo 251 099 8017G4P3013 at 7765w6d was admitted to the hospital 07/20/2015 for  cesarean section with the following indication: gestational hypertension, repeat cesarean section Membrane Rupture Time/Date: 12:16 PM ,07/20/2015   Patient delivered a Viable infant.07/20/2015  Details of operation can be found in separate operative note.  Pateint had an uncomplicated postpartum course.  She is ambulating, tolerating a regular diet, passing flatus, and urinating well. Patient is discharged home in stable condition on  07/22/2015        BPs wnl PP. No antihypertensives or Mg. Nurse will visit home tomorrow and at 7 days for bp checks. preE return precautions discussed. SW saw (no pnc) - no barriers to d/c  Physical exam  Filed Vitals:   07/21/15 1412 07/21/15 1741 07/21/15 1840 07/22/15 0620  BP: 122/53 82/48 121/75 127/62  Pulse: 89 100 78 96  Temp: 98.5 F (36.9 C) 98.7 F (37.1 C)  98.5 F (36.9 C)  TempSrc: Oral Oral  Oral  Resp: 20 22 20 19   Height:      Weight:      SpO2: 100%      General: alert, cooperative and no distress Lochia: appropriate Uterine Fundus: firm Incision: No significant erythema, Dressing is clean, dry, and intact DVT  Evaluation: No cords or calf tenderness. No significant calf/ankle edema. Labs: Lab Results  Component Value Date   WBC 8.1 07/21/2015   HGB 9.0* 07/21/2015   HCT 28.1* 07/21/2015   MCV 69.4* 07/21/2015   PLT 177 07/21/2015   CMP Latest Ref Rng 07/20/2015  Glucose 65 - 99 mg/dL 89  BUN 6 - 20 mg/dL 9  Creatinine 1.470.44 - 8.291.00 mg/dL 5.620.58  Sodium 130135 - 865145 mmol/L 135  Potassium 3.5 - 5.1 mmol/L 4.2  Chloride 101 - 111 mmol/L 105  CO2 22 - 32 mmol/L 22  Calcium 8.9 - 10.3 mg/dL 8.9  Total Protein 6.5 - 8.1 g/dL 6.5  Total Bilirubin 0.3 - 1.2 mg/dL 0.7  Alkaline Phos 38 - 126 U/L 100  AST 15 - 41 U/L 12(L)  ALT 14 - 54 U/L 8(L)    Discharge instruction: per After Visit Summary and "Baby and Me Booklet".  After visit meds:    Medication List    TAKE these medications        acetaminophen 325 MG  tablet  Commonly known as:  TYLENOL  Take 650 mg by mouth every 6 (six) hours as needed for mild pain or headache.     ibuprofen 600 MG tablet  Commonly known as:  ADVIL,MOTRIN  Take 1 tablet (600 mg total) by mouth every 6 (six) hours.     oxyCODONE 5 MG immediate release tablet  Commonly known as:  Oxy IR/ROXICODONE  Take 1 tablet (5 mg total) by mouth every 4 (four) hours as needed (pain scale 4-7).     prenatal multivitamin Tabs tablet  Take 1 tablet by mouth daily at 12 noon.     senna-docusate 8.6-50 MG tablet  Commonly known as:  Senokot-S  Take 1 tablet by mouth at bedtime as needed for mild constipation.        Diet: routine diet  Activity: Advance as tolerated. Pelvic rest for 6 weeks.   Outpatient follow up:6 weeks Follow up Appt:Future Appointments Date Time Provider Department Center  07/23/2015 9:20 AM Levie HeritageJacob J Stinson, DO [facility]   Follow up Visit:No Follow-up on file.  Postpartum contraception: Undecided  Newborn Data: Live born female  Birth Weight: 8 lb 3.2 oz (3720 g) APGAR: 8, 9  Baby Feeding: Breast Disposition:home with  mother   07/22/2015 Silvano BilisNoah B Rhena Glace, MD

## 2015-07-22 NOTE — Discharge Instructions (Signed)
Hypertension During Pregnancy Hypertension is also called high blood pressure. Blood pressure moves blood in your body. Sometimes, the force that moves the blood becomes too strong. When you are pregnant, this condition should be watched carefully. It can cause problems for you and your baby. HOME CARE   Make and keep all of your doctor visits.  Take medicine as told by your doctor. Tell your doctor about all medicines you take.  Eat very little salt.  Exercise regularly.  Do not drink alcohol.  Do not smoke.  Do not have drinks with caffeine.  Lie on your left side when resting.  Your health care provider may ask you to take one low-dose aspirin (81mg ) each day. GET HELP RIGHT AWAY IF:  You have bad belly (abdominal) pain.  You have sudden puffiness (swelling) in the hands, ankles, or face.  You gain 4 pounds (1.8 kilograms) or more in 1 week.  You throw up (vomit) repeatedly.  You have bleeding from the vagina.  You do not feel the baby moving as much.  You have a headache.  You have blurred or double vision.  You have muscle twitching or spasms.  You have shortness of breath.  You have blue fingernails and lips.  You have blood in your pee (urine). MAKE SURE YOU:  Understand these instructions.  Will watch your condition.  Will get help right away if you are not doing well or get worse.   This information is not intended to replace advice given to you by your health care provider. Make sure you discuss any questions you have with your health care provider.   Document Released: 02/05/2010 Document Revised: 01/24/2014 Document Reviewed: 08/02/2012 Elsevier Interactive Patient Education 2016 Elsevier Inc. Cesarean Delivery, Care After Refer to this sheet in the next few weeks. These instructions provide you with information on caring for yourself after your procedure. Your health care provider may also give you specific instructions. Your treatment has been  planned according to current medical practices, but problems sometimes occur. Call your health care provider if you have any problems or questions after you go home. HOME CARE INSTRUCTIONS  Only take over-the-counter or prescription medications as directed by your health care provider.  Do not drink alcohol, especially if you are breastfeeding or taking medication to relieve pain.  Do not chew or smoke tobacco.  Continue to use good perineal care. Good perineal care includes:  Wiping your perineum from front to back.  Keeping your perineum clean.  Check your surgical cut (incision) daily for increased redness, drainage, swelling, or separation of skin.  Clean your incision gently with soap and water every day, and then pat it dry. If your health care provider says it is okay, leave the incision uncovered. Use a bandage (dressing) if the incision is draining fluid or appears irritated. If the adhesive strips across the incision do not fall off within 7 days, carefully peel them off.  Hug a pillow when coughing or sneezing until your incision is healed. This helps to relieve pain.  Do not use tampons or douche until your health care provider says it is okay.  Shower, wash your hair, and take tub baths as directed by your health care provider.  Wear a well-fitting bra that provides breast support.  Limit wearing support panties or control-top hose.  Drink enough fluids to keep your urine clear or pale yellow.  Eat high-fiber foods such as whole grain cereals and breads, brown rice, beans, and fresh fruits  and vegetables every day. These foods may help prevent or relieve constipation.  Resume activities such as climbing stairs, driving, lifting, exercising, or traveling as directed by your health care provider.  Talk to your health care provider about resuming sexual activities. This is dependent upon your risk of infection, your rate of healing, and your comfort and desire to resume  sexual activity.  Try to have someone help you with your household activities and your newborn for at least a few days after you leave the hospital.  Rest as much as possible. Try to rest or take a nap when your newborn is sleeping.  Increase your activities gradually.  Keep all of your scheduled postpartum appointments. It is very important to keep your scheduled follow-up appointments. At these appointments, your health care provider will be checking to make sure that you are healing physically and emotionally. SEEK MEDICAL CARE IF:   You are passing large clots from your vagina. Save any clots to show your health care provider.  You have a foul smelling discharge from your vagina.  You have trouble urinating.  You are urinating frequently.  You have pain when you urinate.  You have a change in your bowel movements.  You have increasing redness, pain, or swelling near your incision.  You have pus draining from your incision.  Your incision is separating.  You have painful, hard, or reddened breasts.  You have a severe headache.  You have blurred vision or see spots.  You feel sad or depressed.  You have thoughts of hurting yourself or your newborn.  You have questions about your care, the care of your newborn, or medications.  You are dizzy or light-headed.  You have a rash.  You have pain, redness, or swelling at the site of the removed intravenous access (IV) tube.  You have nausea or vomiting.  You stopped breastfeeding and have not had a menstrual period within 12 weeks of stopping.  You are not breastfeeding and have not had a menstrual period within 12 weeks of delivery.  You have a fever. SEEK IMMEDIATE MEDICAL CARE IF:  You have persistent pain.  You have chest pain.  You have shortness of breath.  You faint.  You have leg pain.  You have stomach pain.  Your vaginal bleeding saturates 2 or more sanitary pads in 1 hour. MAKE SURE YOU:    Understand these instructions.  Will watch your condition.  Will get help right away if you are not doing well or get worse.   This information is not intended to replace advice given to you by your health care provider. Make sure you discuss any questions you have with your health care provider.   Document Released: 09/25/2001 Document Revised: 01/24/2014 Document Reviewed: 08/31/2011 Elsevier Interactive Patient Education [employer] Elsevier Inc.

## 2015-07-23 ENCOUNTER — Encounter: Payer: Medicaid Other | Admitting: Family Medicine

## 2015-07-23 ENCOUNTER — Telehealth: Payer: Self-pay | Admitting: *Deleted

## 2015-07-23 NOTE — Telephone Encounter (Signed)
Pt called today and wanted to know if she needs to be seen. She delivered her baby on 7/3 and was told to call our office if she has any swelling. She is now reporting swelling of one of her feet. She denies swelling of face or hands. She also denies any visual disturbances or headache which is typical with pre-eclampsia. Pt was advised to increase po fluid intake today and to call back or come to hospital if pre-e sx occur. Pt voiced understanding.

## 2015-09-03 ENCOUNTER — Encounter: Payer: Self-pay | Admitting: *Deleted

## 2015-09-24 ENCOUNTER — Ambulatory Visit: Payer: Medicaid Other | Admitting: Obstetrics and Gynecology

## 2015-09-25 ENCOUNTER — Encounter: Payer: Self-pay | Admitting: Obstetrics and Gynecology

## 2015-09-25 NOTE — Progress Notes (Signed)
Patient did not keep postpartum visit for 09/24/2015.  Monique Wilkins, Jr MD [facility]ending [facility](Faculty Practice)

## 2016-04-02 ENCOUNTER — Emergency Department (HOSPITAL_COMMUNITY)
Admission: EM | Admit: 2016-04-02 | Discharge: 2016-04-02 | Disposition: A | Discharge status: Home / Self Care | Payer: Managed Care, Other (non HMO) | Attending: Emergency Medicine | Admitting: Emergency Medicine

## 2016-04-02 ENCOUNTER — Encounter (HOSPITAL_COMMUNITY): Payer: Self-pay | Admitting: Emergency Medicine

## 2016-04-02 DIAGNOSIS — Y92410 Unspecified street and highway as the place of occurrence of the external cause: Secondary | ICD-10-CM | POA: Insufficient documentation

## 2016-04-02 DIAGNOSIS — J45909 Unspecified asthma, uncomplicated: Secondary | ICD-10-CM | POA: Insufficient documentation

## 2016-04-02 DIAGNOSIS — M542 Cervicalgia: Secondary | ICD-10-CM | POA: Diagnosis present

## 2016-04-02 DIAGNOSIS — M62838 Other muscle spasm: Secondary | ICD-10-CM | POA: Insufficient documentation

## 2016-04-02 DIAGNOSIS — Y999 Unspecified external cause status: Secondary | ICD-10-CM | POA: Diagnosis not present

## 2016-04-02 DIAGNOSIS — Y939 Activity, unspecified: Secondary | ICD-10-CM | POA: Insufficient documentation

## 2016-04-02 MED ORDER — METHOCARBAMOL 500 MG PO TABS: 500.0000 mg | ORAL_TABLET | Freq: Two times a day (BID) | ORAL | 0 refills | Status: DC

## 2016-04-02 MED ORDER — METHOCARBAMOL 500 MG PO TABS
500.0000 mg | ORAL_TABLET | Freq: Once | ORAL | Status: AC
  Administered 2016-04-02: 500 mg via ORAL
  Filled 2016-04-02: qty 1

## 2016-04-02 NOTE — Discharge Instructions (Signed)
Can continue taking naprosyn, add robaxin.  Can use heat therapy as well-- hot shower, heating pad, etc. Follow-up with your doctor if any ongoing issues. Return here for new concerns.
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
## 2016-10-26 ENCOUNTER — Encounter (HOSPITAL_COMMUNITY): Payer: Self-pay | Admitting: Emergency Medicine

## 2016-10-26 ENCOUNTER — Emergency Department (HOSPITAL_COMMUNITY)
Admission: EM | Admit: 2016-10-26 | Discharge: 2016-10-26 | Disposition: A | Discharge status: Home / Self Care | Payer: Managed Care, Other (non HMO) | Attending: Emergency Medicine | Admitting: Emergency Medicine

## 2016-10-26 ENCOUNTER — Emergency Department (HOSPITAL_COMMUNITY): Payer: Managed Care, Other (non HMO)

## 2016-10-26 DIAGNOSIS — R079 Chest pain, unspecified: Secondary | ICD-10-CM | POA: Diagnosis present

## 2016-10-26 DIAGNOSIS — R072 Precordial pain: Secondary | ICD-10-CM | POA: Diagnosis not present

## 2016-10-26 DIAGNOSIS — J45909 Unspecified asthma, uncomplicated: Secondary | ICD-10-CM | POA: Diagnosis not present

## 2016-10-26 LAB — CBC
HEMATOCRIT: 35.6 % — AB (ref 36.0–46.0)
Hemoglobin: 11 g/dL — ABNORMAL LOW (ref 12.0–15.0)
MCH: 21.8 pg — ABNORMAL LOW (ref 26.0–34.0)
MCHC: 30.9 g/dL (ref 30.0–36.0)
MCV: 70.6 fL — AB (ref 78.0–100.0)
Platelets: 322 10*3/uL (ref 150–400)
RBC: 5.04 MIL/uL (ref 3.87–5.11)
RDW: 17.1 % — AB (ref 11.5–15.5)
WBC: 5.7 10*3/uL (ref 4.0–10.5)

## 2016-10-26 LAB — BASIC METABOLIC PANEL
Anion gap: 11 (ref 5–15)
BUN: 15 mg/dL (ref 6–20)
CHLORIDE: 101 mmol/L (ref 101–111)
CO2: 25 mmol/L (ref 22–32)
Calcium: 9.3 mg/dL (ref 8.9–10.3)
Creatinine, Ser: 0.76 mg/dL (ref 0.44–1.00)
GFR calc Af Amer: >60 mL/min (ref >60)
GFR calc non Af Amer: >60 mL/min (ref >60)
Glucose, Bld: 111 mg/dL — ABNORMAL HIGH (ref 65–99)
POTASSIUM: 3.9 mmol/L (ref 3.5–5.1)
Sodium: 137 mmol/L (ref 135–145)

## 2016-10-26 LAB — I-STAT TROPONIN, ED: Troponin i, poc: 0 ng/mL (ref 0.00–0.08)

## 2016-10-26 MED ORDER — KETOROLAC TROMETHAMINE 30 MG/ML IJ SOLN
30.0000 mg | Freq: Once | INTRAMUSCULAR | Status: AC
  Administered 2016-10-26: 30 mg via INTRAVENOUS
  Filled 2016-10-26: qty 1

## 2016-10-26 MED ORDER — ALUM & MAG HYDROXIDE-SIMETH 200-200-20 MG/5ML PO SUSP
30.0000 mL | Freq: Once | ORAL | Status: AC
  Administered 2016-10-26: 30 mL via ORAL
  Filled 2016-10-26: qty 30

## 2016-10-26 MED ORDER — OMEPRAZOLE 20 MG PO CPDR: 20.0000 mg | DELAYED_RELEASE_CAPSULE | Freq: Every day | ORAL | 0 refills | Status: DC

## 2016-10-26 NOTE — ED Provider Notes (Signed)
[facility]-EMERGENCY DEPT Provider Note   CSN: [id number] Arrival date & time: 10/26/16  0650     History   Chief Complaint Chief Complaint  Patient presents with  . Chest Pain    HPI Monique Wilkins is a 26 y.o. female.  HPI Patient is a 26 year old female who presents with chest pressure and back pain began around 4 AM today.  It's been constant.  It is not pleuritic.  No history DVT or pulmonary embolism.  No family history of early cardiac disease.  Denies palpitations.  No shortness of breath.  Denies nausea vomiting.  No diaphoresis.  She states is somewhat sore to the touch.  She states she feels better when she sits forward.  Patient has tried Tylenol prior to arrival without improvement in her symptoms.  Symptoms are mild in severity   Past Medical History:  Diagnosis Date  . Asthma   . Cesarean delivery delivered     Patient Active Problem List   Diagnosis Date Noted  . Gestational hypertension 07/22/2015  . Rubella non-immune status, antepartum 07/20/2015  . Status post repeat low transverse cesarean section 07/20/2015  . Group B Streptococcus carrier, antepartum 07/19/2015  . Previous cesarean delivery, antepartum 07/18/2015  . Supervision of high risk pregnancy, antepartum 07/18/2015  . Late prenatal care affecting pregnancy, antepartum 07/18/2015    Past Surgical History:  Procedure Laterality Date  . CESAREAN SECTION    . CESAREAN SECTION N/A 07/20/2015   Procedure: CESAREAN SECTION;  Surgeon: Glasgow Bing, MD;  Location: [facility] BIRTHING SUITES;  Service: Obstetrics;  Laterality: N/A;    OB History    Gravida Para Term Preterm AB Living   SAB TAB Ectopic Multiple Live Births   1     0 3       Home Medications    Prior to Admission medications   Medication Sig Start Date End Date Taking? Authorizing Provider  acetaminophen (TYLENOL) 500 MG tablet Take 2,000 mg by mouth every 6 (six) hours as needed for mild pain, moderate pain,  fever or headache.   Yes [provider]  omeprazole (PRILOSEC) 20 MG capsule Take 1 capsule (20 mg total) by mouth daily. 10/26/16   Azalia Bilis, MD    Family History Family History  Problem Relation Age of Onset  . Alcohol abuse Neg Hx     Social History Social History  Substance Use Topics  . Smoking status: Never Smoker  . Smokeless tobacco: Never Used  . Alcohol use No     Allergies   Patient has no known allergies.   Review of Systems Review of Systems  All other systems reviewed and are negative.    Physical Exam Updated Vital Signs BP (!) 111/53   Pulse 82   Temp 98.5 F (36.9 C) (Oral)   Resp 16   Ht 5' 3.5" (1.613 m)   Wt 97.5 kg (215 lb)   LMP 10/13/2016   SpO2 100%   BMI 37.49 kg/m   Physical Exam  Constitutional: She is oriented to person, place, and time. She appears well-developed and well-nourished. No distress.  HENT:  Head: Normocephalic and atraumatic.  Eyes: EOM are normal.  Neck: Normal range of motion.  Cardiovascular: Normal rate, regular rhythm and normal heart sounds.   Pulmonary/Chest: Effort normal and breath sounds normal.  Abdominal: Soft. She exhibits no distension. There is no tenderness.  Musculoskeletal: Normal range of motion.  Neurological: She is alert and  oriented to person, place, and time.  Skin: Skin is warm and dry.  Psychiatric: She has a normal mood and affect. Judgment normal.  Nursing note and vitals reviewed.    ED Treatments / Results  Labs (all labs ordered are listed, but only abnormal results are displayed) Labs Reviewed  BASIC METABOLIC PANEL - Abnormal; Notable for the following:       Result Value   Glucose, Bld 111 (*)    All other components within normal limits  CBC - Abnormal; Notable for the following:    Hemoglobin 11.0 (*)    HCT 35.6 (*)    MCV 70.6 (*)    MCH 21.8 (*)    RDW 17.1 (*)    All other components within normal limits  I-STAT TROPONIN, ED    EKG  EKG  Interpretation  Date/Time:  Wednesday October 26 2016 07:11:56 EDT Ventricular Rate:  88 PR Interval:    QRS Duration: 87 QT Interval:  366 QTC Calculation: 443 R Axis:   45 Text Interpretation:  Sinus rhythm No old tracing to compare Confirmed by Azalia Bilis ([id number]) on 10/26/2016 7:53:20 AM       Radiology Dg Chest 2 View  Result Date: 10/26/2016 CLINICAL DATA:  26 year old female with chest pain radiating to the back since 0400 hours. EXAM: CHEST  2 VIEW COMPARISON:  None. FINDINGS: Normal cardiac size and mediastinal contours. Large body habitus. Somewhat low lung volumes. The lungs are clear. No pneumothorax or pleural effusion. Visualized tracheal air column is within normal limits. No osseous abnormality identified. Negative visible bowel gas pattern. IMPRESSION: Negative.  No acute cardiopulmonary abnormality. Electronically Signed   By: Odessa Fleming M.D.   On: 10/26/2016 07:53    Procedures Procedures (including critical care time)  Medications Ordered in ED Medications  alum & mag hydroxide-simeth (MAALOX/MYLANTA) 200-200-20 MG/5ML suspension 30 mL (not administered)  ketorolac (TORADOL) 30 MG/ML injection 30 mg (not administered)     Initial Impression / Assessment and Plan / ED Course  I have reviewed the triage vital signs and the nursing notes.  Pertinent labs & imaging results that were available during my care of the patient were reviewed by me and considered in my medical decision making (see chart for details).     Atypical chest pain.  Doubt PE.  Doubt ACS.  HEART score 0.  Will place on Prilosec.  Primary care follow-up.  Patient understands return to the ER for new or worsening symptoms.  Final Clinical Impressions(s) / ED Diagnoses   Final diagnoses:  Precordial pain    New Prescriptions New Prescriptions   OMEPRAZOLE (PRILOSEC) 20 MG CAPSULE    Take 1 capsule (20 mg total) by mouth daily.     Azalia Bilis, MD 10/26/16 [phone]

## 2016-10-26 NOTE — ED Triage Notes (Signed)
Patient states she started having chest pain and back pain around 4 am today. Patient states that it feels like it is a heavy pressure.

## 2017-02-01 IMAGING — US US MFM FETAL BPP W/O NON-STRESS
1 series · 13 of 14 positions shown · non-contrast
Comparison: none

[Series 1: us mfm fetal bpp w/o non-stress · 14 acquisitions, 13 frames shown]
[im 1/14]
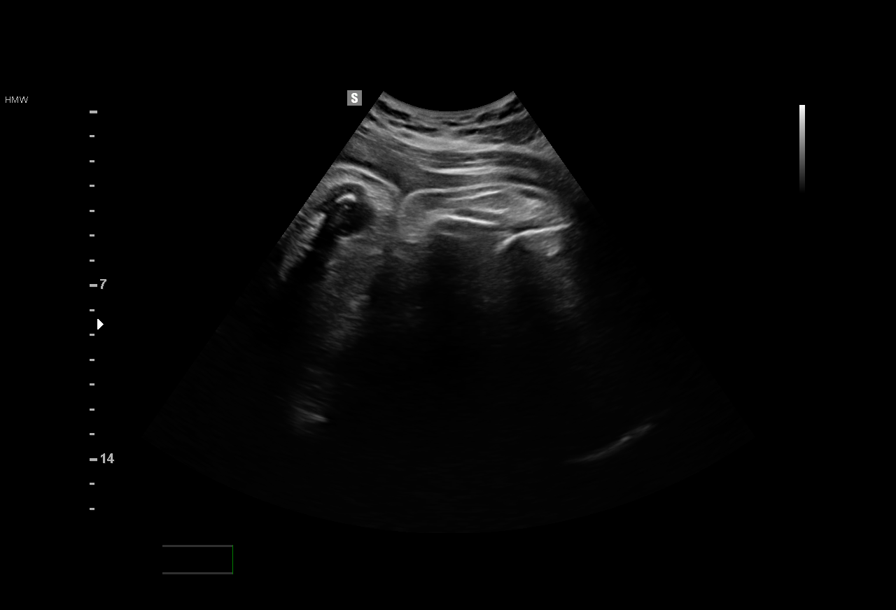
[im 2/14]
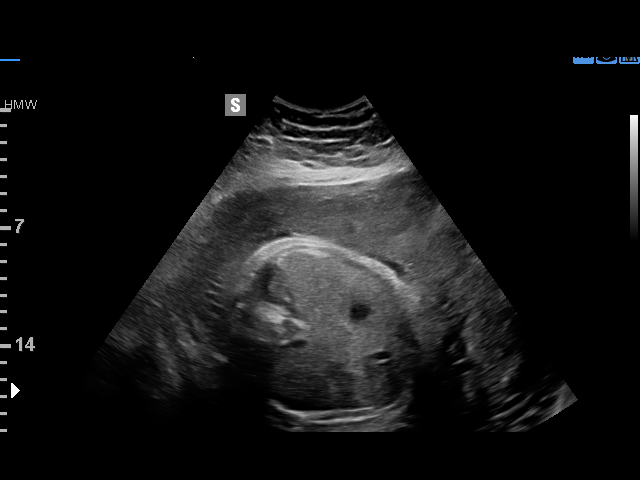
[im 3/14]
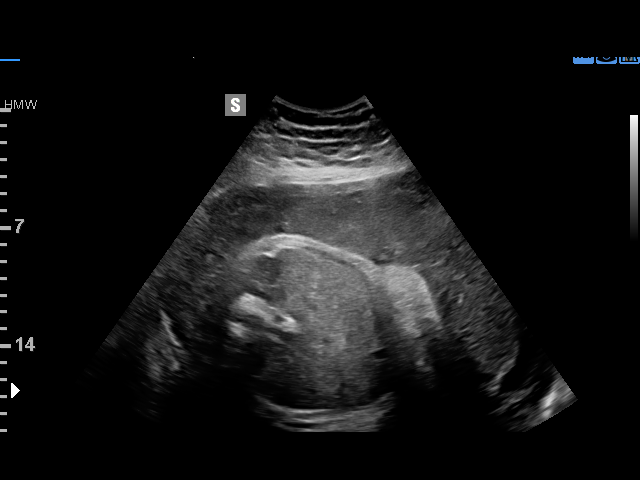
[im 4/14]
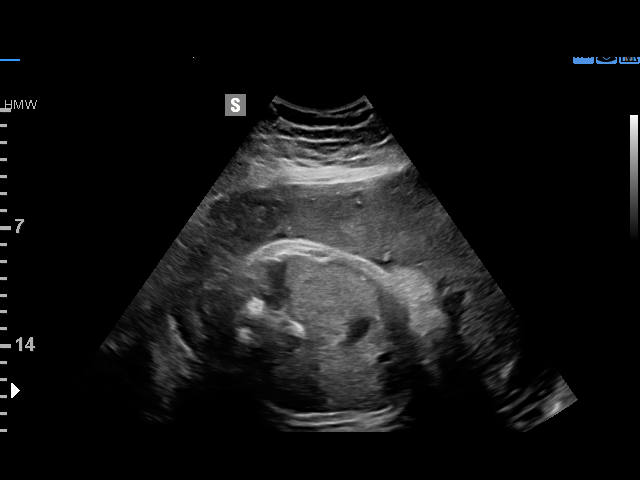
[im 5/14]
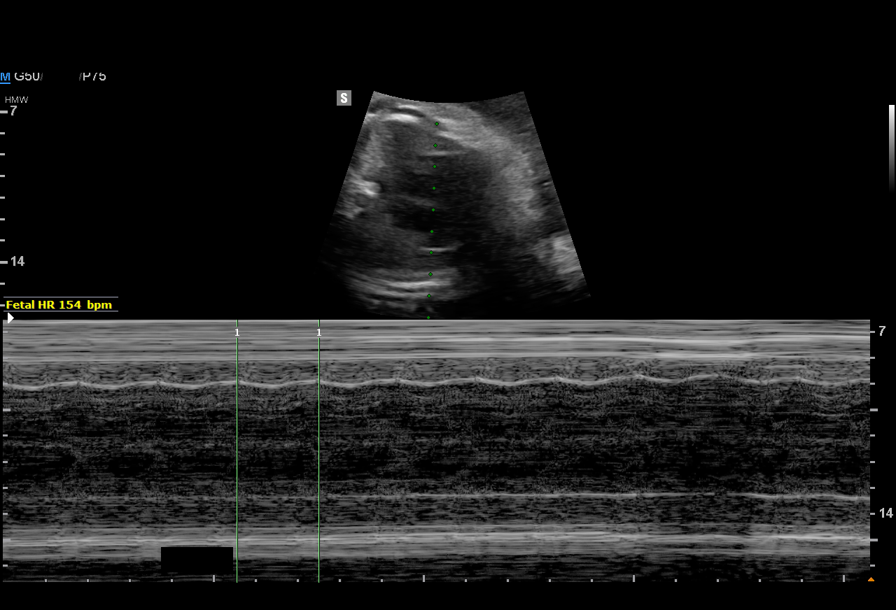
[im 6/14]
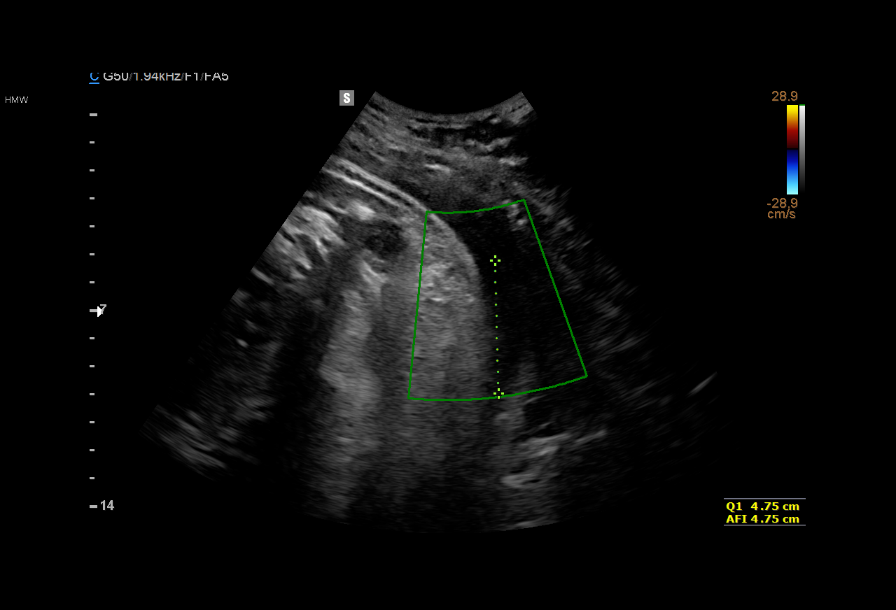
[im 8/14]
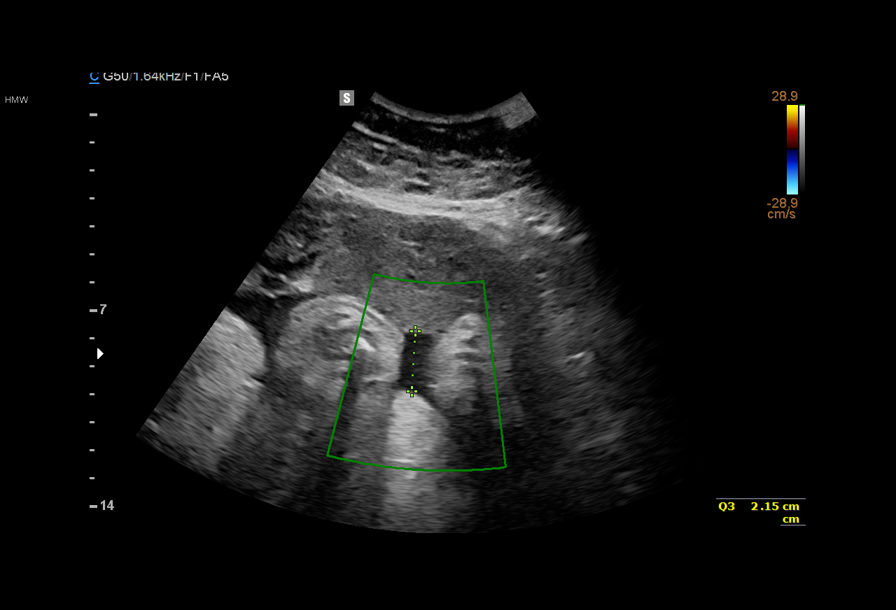
[im 9/14]
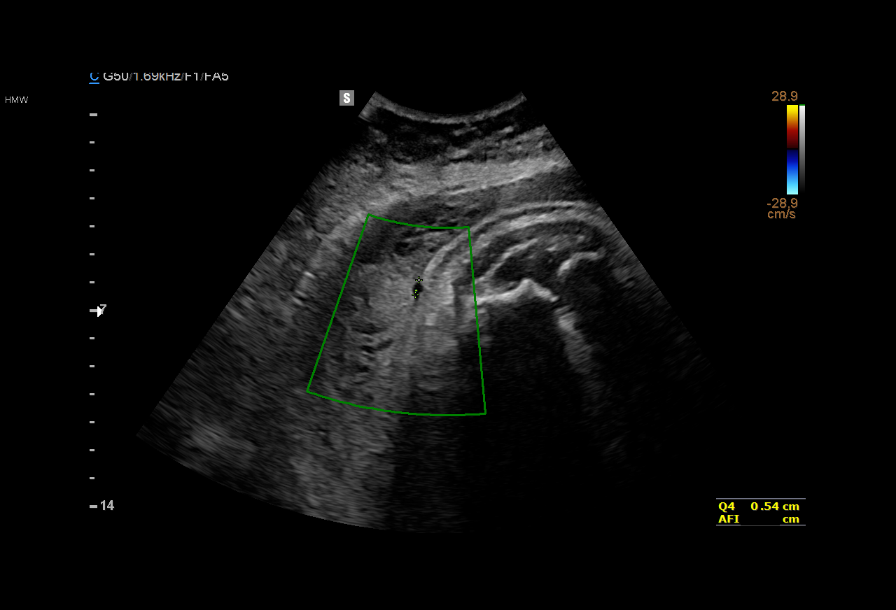
[im 10/14]
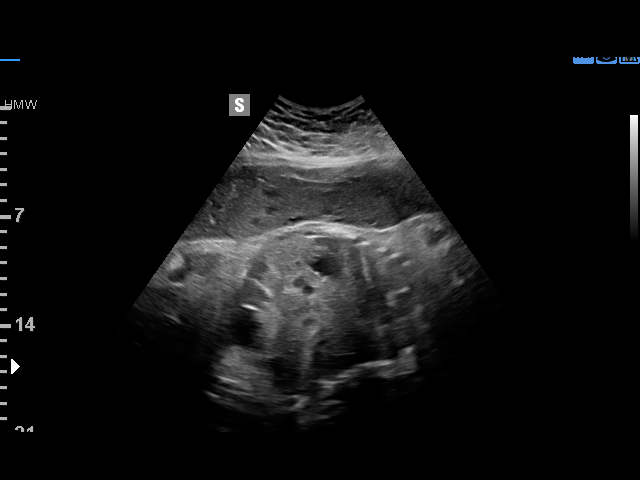
[im 11/14]
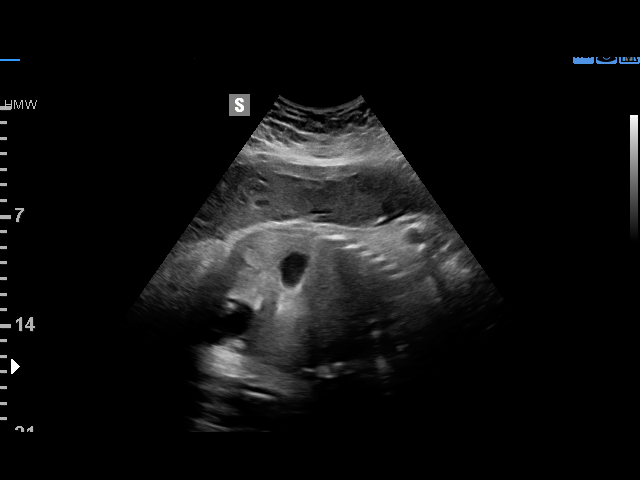
[im 12/14]
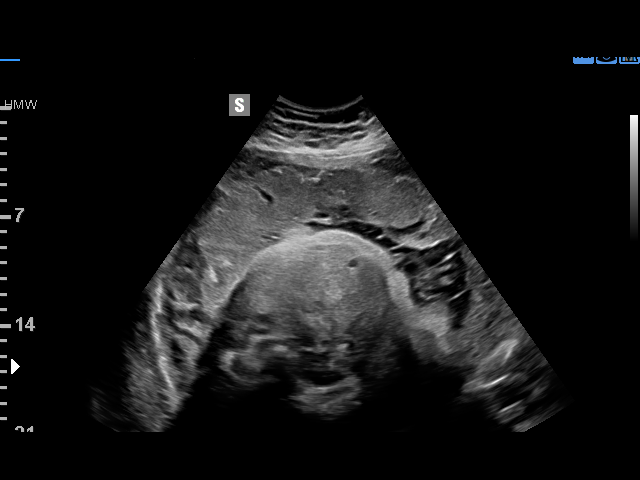
[im 13/14]
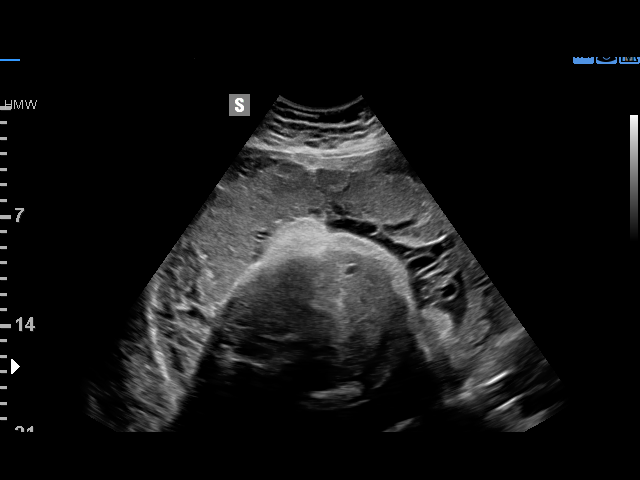
[im 14/14]
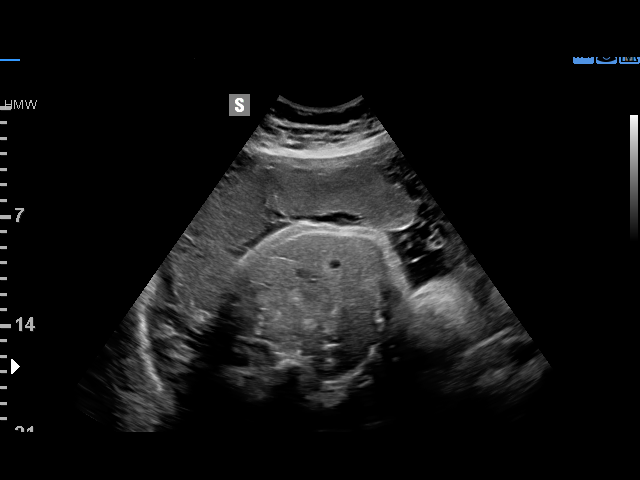

[13 of 14 positions shown; findings below may reference images not displayed]

[REDACTED]
MAU/Triage

1  ALZAHRANI            [id number]      [id number]     [id number]
Indications

38 weeks gestation of pregnancy
Non-reactive NST, FHR decelerations
Maternal morbid obesity
OB History

Blood Type:            Height:  5'3"   Weight (lb):  289      BMI:
Gravidity:    4         Term:   2         SAB:   1
Living:       2
Fetal Evaluation

Num Of Fetuses:     1
Fetal Heart         154
Rate(bpm):
Cardiac Activity:   Observed
Presentation:       Cephalic

Amniotic Fluid
AFI FV:      Subjectively within normal limits

AFI Sum(cm)     %Tile       Largest Pocket(cm)
12.01           44

RUQ(cm)       RLQ(cm)       LUQ(cm)        LLQ(cm)
4.75
Biophysical Evaluation
Amniotic F.V:   Within normal limits       F. Tone:        Observed
F. Movement:    Observed                   Score:          [DATE]
F. Breathing:   Observed
Gestational Age

Clinical EDD:  27w 4d                                        EDD:   10/15/15
Best:          38w 6d    Det. By:   U/S  (07/18/15)          EDD:   07/28/15
Cervix Uterus Adnexa

Cervix
Not visualized (advanced GA >91wks)
Impression

Single IUP at 38w 6d
BPP [DATE]
Cephalic presentation
Normal amniotic fluid volume
Recommendations

Follow-up ultrasounds as clinically indicated.

## 2019-11-20 ENCOUNTER — Other Ambulatory Visit: Payer: Self-pay

## 2019-11-20 ENCOUNTER — Emergency Department (HOSPITAL_COMMUNITY)
Admission: EM | Admit: 2019-11-20 | Discharge: 2019-11-20 | Disposition: A | Discharge status: Home / Self Care | Payer: Managed Care, Other (non HMO) | Attending: Emergency Medicine | Admitting: Emergency Medicine

## 2019-11-20 ENCOUNTER — Encounter (HOSPITAL_COMMUNITY): Payer: Self-pay | Admitting: Emergency Medicine

## 2019-11-20 DIAGNOSIS — R519 Headache, unspecified: Secondary | ICD-10-CM | POA: Insufficient documentation

## 2019-11-20 DIAGNOSIS — H6062 Unspecified chronic otitis externa, left ear: Secondary | ICD-10-CM | POA: Insufficient documentation

## 2019-11-20 DIAGNOSIS — J45909 Unspecified asthma, uncomplicated: Secondary | ICD-10-CM | POA: Insufficient documentation

## 2019-11-20 LAB — CBG MONITORING, ED: Glucose-Capillary: 109 mg/dL — ABNORMAL HIGH (ref 70–99)

## 2019-11-20 MED ORDER — CIPROFLOXACIN-HYDROCORTISONE 0.2-1 % OT SUSP
3.0000 [drp] | Freq: Once | OTIC | Status: AC
  Administered 2019-11-20: 3 [drp] via OTIC
  Filled 2019-11-20: qty 10

## 2019-11-20 NOTE — ED Notes (Signed)
Pt left without receiving paperwork or last set of vitals. AVS left up front with registration in case pt decides to come back for it.

## 2019-11-20 NOTE — ED Triage Notes (Signed)
Patient arrives to ED with complaints of left ear fullness and pain x3 days. States today the left side of her face started to ache. No fevers or chills.

## 2019-11-20 NOTE — ED Notes (Signed)
Checked patient blood sugar it was 109 notified RN of blood sugar

## 2019-11-20 NOTE — Discharge Instructions (Signed)
Please read and follow all provided instructions.  Your diagnoses today include:  1. Chronic otitis externa of left ear, unspecified type     Tests performed today include:  Vital signs. See below for your results today.   Medications prescribed:   Cipro HC - ear drops for external ear infection.   Use as follows: Instill 3 drops into the left ear twice daily for 7 days.   Take any prescribed medications only as directed.  Home care instructions:  Follow any educational materials contained in this packet.  BE VERY CAREFUL not to take multiple medicines containing Tylenol (also called acetaminophen). Doing so can lead to an overdose which can damage your liver and cause liver failure and possibly death.   Follow-up instructions: Please follow-up with the ENT (ear doctor) listed in 1 week for further evaluation of your symptoms.   Return instructions:   Please return to the Emergency Department if you experience worsening symptoms.   Please return if you have any other emergent concerns.  Additional Information:  Your vital signs today were: BP (!) 132/100 (BP Location: Left Wrist)   Pulse 88   Temp 98.3 F (36.8 C) (Oral)   Resp 20   Ht 5\' 3"  (1.6 m)   Wt 97.5 kg   SpO2 100%   BMI 38.09 kg/m  If your blood pressure (BP) was elevated above 135/85 this visit, please have this repeated by your doctor within one month. --------------

## 2019-11-20 NOTE — ED Provider Notes (Signed)
[facility] EMERGENCY DEPARTMENT Provider Note   CSN: [id number] Arrival date & time: 11/20/19  7902     History Chief Complaint  Patient presents with  . Ear Fullness    Monique Wilkins is a 29 y.o. female.  Patient presents to the emergency department for evaluation of left ear pain.  Patient has had recurrent otitis externa over the past 3 months.  She states that she has been treated with eardrops to previous times.  She was encouraged to follow-up with ENT but has not been able to.  Today she began having pain in her left face.  She denies fevers, nausea or vomiting.  She denies dental pain or swelling.  No previous history of diabetes or immunocompromise.  She states that she needed to have a wick in her ear in the past.        Past Medical History:  Diagnosis Date  . Asthma   . Cesarean delivery delivered     Patient Active Problem List   Diagnosis Date Noted  . Gestational hypertension 07/22/2015  . Rubella non-immune status, antepartum 07/20/2015  . Status post repeat low transverse cesarean section 07/20/2015  . Group B Streptococcus carrier, antepartum 07/19/2015  . Previous cesarean delivery, antepartum 07/18/2015  . Supervision of high risk pregnancy, antepartum 07/18/2015  . Late prenatal care affecting pregnancy, antepartum 07/18/2015    Past Surgical History:  Procedure Laterality Date  . CESAREAN SECTION    . CESAREAN SECTION N/A 07/20/2015   Procedure: CESAREAN SECTION;  Surgeon: [place] Bing, MD;  Location: [facility] BIRTHING SUITES;  Service: Obstetrics;  Laterality: N/A;     OB History    Gravida  4   Para  3   Term  3   Preterm      AB  1   Living  3     SAB  1   TAB      Ectopic      Multiple  0   Live Births  3           Family History  Problem Relation Age of Onset  . Alcohol abuse Neg Hx     Social History   Tobacco Use  . Smoking status: Never Smoker  . Smokeless tobacco: Never Used    Substance Use Topics  . Alcohol use: No  . Drug use: No    Home Medications Prior to Admission medications   Medication Sig Start Date End Date Taking? Authorizing Provider  acetaminophen (TYLENOL) 500 MG tablet Take 2,000 mg by mouth every 6 (six) hours as needed for mild pain, moderate pain, fever or headache.    [provider]  omeprazole (PRILOSEC) 20 MG capsule Take 1 capsule (20 mg total) by mouth daily. 10/26/16   Azalia Bilis, MD    Allergies    Patient has no known allergies.  Review of Systems   Review of Systems  Constitutional: Negative for chills, fatigue and fever.  HENT: Positive for ear pain. Negative for congestion, ear discharge, facial swelling, rhinorrhea, sinus pressure and sore throat.        + facial pain  Eyes: Negative for redness.  Respiratory: Negative for cough.   Gastrointestinal: Negative for nausea and vomiting.  Skin: Negative for color change and rash.  Neurological: Positive for headaches.  Hematological: Negative for adenopathy.    Physical Exam Updated Vital Signs BP (!) 132/100 (BP Location: Left Wrist)   Pulse 88   Temp 98.3 F (36.8 C) (Oral)  Resp 20   Ht 5\' 3"  (1.6 m)   Wt 97.5 kg   SpO2 100%   BMI 38.09 kg/m   Physical Exam Vitals and nursing note reviewed.  Constitutional:      Appearance: She is well-developed.  HENT:     Head: Normocephalic and atraumatic.     Right Ear: Tympanic membrane, ear canal and external ear normal.     Ears:     Comments: Left TM is unable to be visualized due to canal occlusion.  There is swelling and mild drainage from the ear canal itself.  Patient has pain with tugging on the pinna.  No mastoid tenderness.    Mouth/Throat:     Comments: Patient's teeth are generally in poor repair.  She has several teeth that are missing or worn down to the gumline.  No obvious swelling or abscess at this time. Eyes:     Conjunctiva/sclera: Conjunctivae normal.  Pulmonary:     Effort: No  respiratory distress.  Musculoskeletal:     Cervical back: Normal range of motion and neck supple.  Skin:    General: Skin is warm and dry.  Neurological:     Mental Status: She is alert.     ED Results / Procedures / Treatments   Labs (all labs ordered are listed, but only abnormal results are displayed) Labs Reviewed  CBG MONITORING, ED - Abnormal; Notable for the following components:      Result Value   Glucose-Capillary 109 (*)    All other components within normal limits    EKG None  Radiology No results found.  Procedures Procedures (including critical care time)  Medications Ordered in ED Medications  ciprofloxacin-hydrocortisone (CIPRO HC OTIC) 0.2-1 % OTIC (EAR) suspension 3 drop (3 drops Left EAR Given 11/20/19 13/3/21)    ED Course  I have reviewed the triage vital signs and the nursing notes.  Pertinent labs & imaging results that were available during my care of the patient were reviewed by me and considered in my medical decision making (see chart for details).  Patient seen and examined.  Patient with recurrent otitis externa on the left.  Will check CBG.  Patient cannot tell me which antibiotic she was on previously.  Will give course of Cipro HC.   Vital signs reviewed and are as follows: BP (!) 132/100 (BP Location: Left Wrist)   Pulse 88   Temp 98.3 F (36.8 C) (Oral)   Resp 20   Ht 5\' 3"  (1.6 m)   Wt 97.5 kg   SpO2 100%   BMI 38.09 kg/m   CBG okay. Ready for d/c.      MDM Rules/Calculators/A&P                          Pt with recurrent otitis externa.  No signs of malignant otitis externa.  CBG checked.  Encouraged ENT follow-up.   Final Clinical Impression(s) / ED Diagnoses Final diagnoses:  Chronic otitis externa of left ear, unspecified type    Rx / DC Orders ED Discharge Orders    None       6812, PA-C 11/20/19 1107    Tegeler, Renne Crigler, MD 11/20/19 1552

## 2022-01-17 NOTE — L&D Delivery Note (Cosign Needed Addendum)
OB/GYN Faculty Practice Delivery Note  Monique Wilkins is a 32 y.o. [id number] s/p NSVD at Unknown. She was admitted after precipitous delivery at home. No PNC.  Patient arrived via EMS. Placenta delivered spontaneously at home. Placed in stirrups and given fentanyl 100 mcg IV for pain. Started on pitocin. Found first degree perineal tear that was not hemostatic and bilateral periurethral tears. Lidocaine used for repair. First degree perineal repaired with 2-0 vicryl. L periurethral required repair for hemostasis with 3-0 vicryl. R hemostatic. Uterus firm throughout. Hemostasis obtained prior to my departure from room. Dr. Vergie Living at bedside to assist with repair.  Joanne Gavel, MD [facility] Family Medicine Fellow, [facility] for [facility], [facility] 09/28/2022, 5:27 AM

## 2022-09-28 ENCOUNTER — Inpatient Hospital Stay (HOSPITAL_COMMUNITY)
Admission: AD | Admit: 2022-09-28 | Discharge: 2022-09-30 | DRG: 776 | Disposition: A | Discharge status: Home / Self Care | Payer: 59 | Attending: Obstetrics and Gynecology | Admitting: Obstetrics and Gynecology

## 2022-09-28 ENCOUNTER — Encounter (HOSPITAL_COMMUNITY): Payer: Self-pay | Admitting: Obstetrics and Gynecology

## 2022-09-28 DIAGNOSIS — O0930 Supervision of pregnancy with insufficient antenatal care, unspecified trimester: Secondary | ICD-10-CM

## 2022-09-28 DIAGNOSIS — O99893 Other specified diseases and conditions complicating puerperium: Secondary | ICD-10-CM | POA: Diagnosis present

## 2022-09-28 DIAGNOSIS — R000 Tachycardia, unspecified: Secondary | ICD-10-CM | POA: Diagnosis present

## 2022-09-28 DIAGNOSIS — Z23 Encounter for immunization: Secondary | ICD-10-CM | POA: Diagnosis not present

## 2022-09-28 DIAGNOSIS — Z3A00 Weeks of gestation of pregnancy not specified: Secondary | ICD-10-CM | POA: Diagnosis not present

## 2022-09-28 DIAGNOSIS — O34219 Maternal care for unspecified type scar from previous cesarean delivery: Secondary | ICD-10-CM | POA: Diagnosis present

## 2022-09-28 DIAGNOSIS — O34211 Maternal care for low transverse scar from previous cesarean delivery: Secondary | ICD-10-CM | POA: Diagnosis not present

## 2022-09-28 DIAGNOSIS — Z8759 Personal history of other complications of pregnancy, childbirth and the puerperium: Secondary | ICD-10-CM

## 2022-09-28 LAB — CBC
HCT: 32.4 % — ABNORMAL LOW (ref 36.0–46.0)
Hemoglobin: 10.4 g/dL — ABNORMAL LOW (ref 12.0–15.0)
MCH: 24.6 pg — ABNORMAL LOW (ref 26.0–34.0)
MCHC: 32.1 g/dL (ref 30.0–36.0)
MCV: 76.6 fL — ABNORMAL LOW (ref 80.0–100.0)
Platelets: 196 10*3/uL (ref 150–400)
RBC: 4.23 MIL/uL (ref 3.87–5.11)
RDW: 15.9 % — ABNORMAL HIGH (ref 11.5–15.5)
WBC: 17.5 10*3/uL — ABNORMAL HIGH (ref 4.0–10.5)
nRBC: 0 % (ref 0.0–0.2)

## 2022-09-28 LAB — RAPID HIV SCREEN (HIV 1/2 AB+AG)
HIV 1/2 Antibodies: NONREACTIVE
HIV-1 P24 Antigen - HIV24: NONREACTIVE

## 2022-09-28 LAB — HEPATITIS B SURFACE ANTIGEN: Hepatitis B Surface Ag: NONREACTIVE

## 2022-09-28 LAB — RAPID URINE DRUG SCREEN, HOSP PERFORMED
Amphetamines: NOT DETECTED
Barbiturates: NOT DETECTED
Benzodiazepines: NOT DETECTED
Cocaine: NOT DETECTED
Opiates: NOT DETECTED
Tetrahydrocannabinol: POSITIVE — AB

## 2022-09-28 LAB — RPR: RPR Ser Ql: NONREACTIVE

## 2022-09-28 LAB — HEPATITIS C ANTIBODY: HCV Ab: NONREACTIVE

## 2022-09-28 LAB — TYPE AND SCREEN
ABO/RH(D): A POS
Antibody Screen: NEGATIVE

## 2022-09-28 MED ORDER — WITCH HAZEL-GLYCERIN EX PADS: 1.0000 | MEDICATED_PAD | CUTANEOUS | Status: DC | PRN

## 2022-09-28 MED ORDER — TETANUS-DIPHTH-ACELL PERTUSSIS 5-2.5-18.5 LF-MCG/0.5 IM SUSY
0.5000 mL | PREFILLED_SYRINGE | Freq: Once | INTRAMUSCULAR | Status: AC
  Administered 2022-09-30: 0.5 mL via INTRAMUSCULAR
  Filled 2022-09-28: qty 0.5

## 2022-09-28 MED ORDER — BENZOCAINE-MENTHOL 20-0.5 % EX AERO: 1.0000 | INHALATION_SPRAY | CUTANEOUS | Status: DC | PRN

## 2022-09-28 MED ORDER — BENZOCAINE-MENTHOL 20-0.5 % EX AERO
1.0000 | INHALATION_SPRAY | CUTANEOUS | Status: DC | PRN
  Filled 2022-09-28: qty 56

## 2022-09-28 MED ORDER — ACETAMINOPHEN 325 MG PO TABS: 650.0000 mg | ORAL_TABLET | ORAL | Status: DC | PRN

## 2022-09-28 MED ORDER — ONDANSETRON HCL 4 MG/2ML IJ SOLN: 4.0000 mg | INTRAMUSCULAR | Status: DC | PRN

## 2022-09-28 MED ORDER — PRENATAL MULTIVITAMIN CH
1.0000 | ORAL_TABLET | Freq: Every day | ORAL | Status: DC
  Administered 2022-09-28 – 2022-09-30 (×3): 1 via ORAL
  Filled 2022-09-28 (×3): qty 1

## 2022-09-28 MED ORDER — LACTATED RINGERS IV SOLN: 500.0000 mL | INTRAVENOUS | Status: DC | PRN

## 2022-09-28 MED ORDER — IBUPROFEN 600 MG PO TABS
600.0000 mg | ORAL_TABLET | Freq: Four times a day (QID) | ORAL | Status: DC
  Administered 2022-09-28 – 2022-09-30 (×9): 600 mg via ORAL
  Filled 2022-09-28 (×9): qty 1

## 2022-09-28 MED ORDER — DIPHENHYDRAMINE HCL 25 MG PO CAPS: 25.0000 mg | ORAL_CAPSULE | Freq: Four times a day (QID) | ORAL | Status: DC | PRN

## 2022-09-28 MED ORDER — SIMETHICONE 80 MG PO CHEW: 80.0000 mg | CHEWABLE_TABLET | ORAL | Status: DC | PRN

## 2022-09-28 MED ORDER — PRENATAL MULTIVITAMIN CH: 1.0000 | ORAL_TABLET | Freq: Every day | ORAL | Status: DC

## 2022-09-28 MED ORDER — FENTANYL CITRATE (PF) 100 MCG/2ML IJ SOLN
INTRAMUSCULAR | Status: AC
  Administered 2022-09-28: 50 ug via INTRAVENOUS
  Filled 2022-09-28: qty 2

## 2022-09-28 MED ORDER — OXYCODONE-ACETAMINOPHEN 5-325 MG PO TABS: 2.0000 | ORAL_TABLET | ORAL | Status: DC | PRN

## 2022-09-28 MED ORDER — LACTATED RINGERS IV SOLN: INTRAVENOUS | Status: DC

## 2022-09-28 MED ORDER — OXYTOCIN-SODIUM CHLORIDE 30-0.9 UT/500ML-% IV SOLN: 2.5000 [IU]/h | INTRAVENOUS | Status: DC

## 2022-09-28 MED ORDER — FENTANYL CITRATE (PF) 100 MCG/2ML IJ SOLN: 50.0000 ug | Freq: Once | INTRAMUSCULAR | Status: AC

## 2022-09-28 MED ORDER — FENTANYL CITRATE (PF) 100 MCG/2ML IJ SOLN
INTRAMUSCULAR | Status: AC
  Filled 2022-09-28: qty 2

## 2022-09-28 MED ORDER — TETANUS-DIPHTH-ACELL PERTUSSIS 5-2.5-18.5 LF-MCG/0.5 IM SUSY: 0.5000 mL | PREFILLED_SYRINGE | Freq: Once | INTRAMUSCULAR | Status: DC

## 2022-09-28 MED ORDER — OXYTOCIN BOLUS FROM INFUSION
333.0000 mL | Freq: Once | INTRAVENOUS | Status: AC
  Administered 2022-09-28: 333 mL via INTRAVENOUS

## 2022-09-28 MED ORDER — ONDANSETRON HCL 4 MG/2ML IJ SOLN: 4.0000 mg | Freq: Four times a day (QID) | INTRAMUSCULAR | Status: DC | PRN

## 2022-09-28 MED ORDER — SENNOSIDES-DOCUSATE SODIUM 8.6-50 MG PO TABS: 2.0000 | ORAL_TABLET | Freq: Every evening | ORAL | Status: DC | PRN

## 2022-09-28 MED ORDER — ONDANSETRON HCL 4 MG PO TABS: 4.0000 mg | ORAL_TABLET | ORAL | Status: DC | PRN

## 2022-09-28 MED ORDER — COCONUT OIL OIL: 1.0000 | TOPICAL_OIL | Status: DC | PRN

## 2022-09-28 MED ORDER — DIBUCAINE (PERIANAL) 1 % EX OINT: 1.0000 | TOPICAL_OINTMENT | CUTANEOUS | Status: DC | PRN

## 2022-09-28 MED ORDER — LIDOCAINE HCL (PF) 1 % IJ SOLN
INTRAMUSCULAR | Status: AC
  Administered 2022-09-28: 30 mL via SUBCUTANEOUS
  Filled 2022-09-28: qty 30

## 2022-09-28 MED ORDER — SOD CITRATE-CITRIC ACID 500-334 MG/5ML PO SOLN: 30.0000 mL | ORAL | Status: DC | PRN

## 2022-09-28 MED ORDER — SENNOSIDES-DOCUSATE SODIUM 8.6-50 MG PO TABS
2.0000 | ORAL_TABLET | Freq: Every day | ORAL | Status: DC
  Administered 2022-09-29 – 2022-09-30 (×2): 2 via ORAL
  Filled 2022-09-28 (×2): qty 2

## 2022-09-28 MED ORDER — OXYTOCIN-SODIUM CHLORIDE 30-0.9 UT/500ML-% IV SOLN
2.5000 [IU]/h | INTRAVENOUS | Status: DC | PRN
  Filled 2022-09-28: qty 500

## 2022-09-28 MED ORDER — ZOLPIDEM TARTRATE 5 MG PO TABS: 5.0000 mg | ORAL_TABLET | Freq: Every evening | ORAL | Status: DC | PRN

## 2022-09-28 MED ORDER — OXYCODONE-ACETAMINOPHEN 5-325 MG PO TABS: 1.0000 | ORAL_TABLET | ORAL | Status: DC | PRN

## 2022-09-28 MED ORDER — IBUPROFEN 600 MG PO TABS: 600.0000 mg | ORAL_TABLET | Freq: Four times a day (QID) | ORAL | Status: DC

## 2022-09-28 MED ORDER — LIDOCAINE HCL (PF) 1 % IJ SOLN: 30.0000 mL | INTRAMUSCULAR | Status: AC | PRN

## 2022-09-28 MED ORDER — FENTANYL CITRATE (PF) 100 MCG/2ML IJ SOLN
50.0000 ug | Freq: Once | INTRAMUSCULAR | Status: AC
  Administered 2022-09-28: 50 ug via INTRAVENOUS

## 2022-09-28 NOTE — Progress Notes (Signed)
Baby born at home QBL after repair per Dr Earlene Plater was 175

## 2022-09-28 NOTE — Discharge Summary (Signed)
Postpartum Discharge Summary  Date of Service updated***     Patient Name: Monique Wilkins DOB: 04-08-1990 MRN: [id number]  Date of admission: 09/28/2022 Delivery date:09/28/2022 Delivering provider: Delta Bing Date of discharge: 09/30/2022  Admitting diagnosis: VBAC (vaginal birth after Cesarean) [O34.219] Normal labor and delivery [O80] Intrauterine pregnancy: Unknown     Secondary diagnosis:  Principal Problem:   VBAC (vaginal birth after Cesarean) Active Problems:   No prenatal care in current pregnancy   History of gestational hypertension   Normal labor and delivery  Additional problems: ***    Discharge diagnosis: Term Pregnancy Delivered                                              Post partum procedures:*** Augmentation: N/A Complications: home delivery. No [facility]  Hospital course: Onset of Labor With Vaginal Delivery      32 y.o. yo [id number] at Unknown was admitted postpartum on 09/28/2022. Labor course was complicated by precipitous delivery at home. VBAC. Lacerations:    Patient had a postpartum course complicated by ***.  She is ambulating, tolerating a regular diet, passing flatus, and urinating well. Patient is discharged home in stable condition on 09/30/22.  Newborn Data: Birth date:09/28/2022 Birth time:2:50 AM Gender:Female Living status:Living Apgars: ,  Weight:3940 g  Magnesium Sulfate received: No BMZ received: No Rhophylac:No MMR:No T-DaP:Given postpartum Flu: No Transfusion:No  Physical exam  Vitals:   09/29/22 0624 09/29/22 1349 09/29/22 2126 09/30/22 0618  BP: 118/79 122/78 (!) 115/57 (!) 119/59  Pulse: (!) 107 87 93 68  Resp: 18 18 18 18   Temp: 97.9 F (36.6 C) 97.8 F (36.6 C) 98 F (36.7 C) 98.2 F (36.8 C)  TempSrc: Oral Oral Oral Oral  SpO2: 100%  100% 99%   General: {Exam; general:21111117} Lochia: {Desc; appropriate/inappropriate:30686::"appropriate"} Uterine Fundus: {Desc; firm/soft:30687} Incision: {Exam;  incision:21111123} DVT Evaluation: {Exam; JYN:8295621} Labs: Lab Results  Component Value Date   WBC 17.5 (H) 09/29/2022   HGB 8.4 (L) 09/29/2022   HCT 26.6 (L) 09/29/2022   MCV 74.3 (L) 09/29/2022   PLT 197 09/29/2022      Latest Ref Rng & Units 10/26/2016    7:26 AM  CMP  Glucose 65 - 99 mg/dL 308   BUN 6 - 20 mg/dL 15   Creatinine 6.57 - 1.00 mg/dL 8.46   Sodium 962 - 952 mmol/L 137   Potassium 3.5 - 5.1 mmol/L 3.9   Chloride 101 - 111 mmol/L 101   CO2 22 - 32 mmol/L 25   Calcium 8.9 - 10.3 mg/dL 9.3    Edinburgh Score:    09/29/2022   11:30 AM  Edinburgh Postnatal Depression Scale Screening Tool  I have been able to laugh and see the funny side of things. 0  I have looked forward with enjoyment to things. 0  I have blamed myself unnecessarily when things went wrong. 2  I have been anxious or worried for no good reason. 0  I have felt scared or panicky for no good reason. 0  Things have been getting on top of me. 1  I have been so unhappy that I have had difficulty sleeping. 0  I have felt sad or miserable. 0  I have been so unhappy that I have been crying. 0  The thought of harming myself has occurred to me. 0  Inocente Salles Postnatal  Depression Scale Total 3     After visit meds:  Allergies as of 09/30/2022   No Known Allergies      Medication List     TAKE these medications    acetaminophen 325 MG tablet Commonly known as: Tylenol Take 2 tablets (650 mg total) by mouth every 4 (four) hours as needed (for pain scale < 4).   docusate sodium 100 MG capsule Commonly known as: Colace Take 1 capsule (100 mg total) by mouth 2 (two) times daily as needed for mild constipation.   Ferrous Fumarate 324 (106 Fe) MG Tabs tablet Commonly known as: HEMOCYTE - 106 mg FE Take 1 tablet (106 mg of iron total) by mouth every other day for 42 doses. Start taking on: October 01, 2022   ibuprofen 600 MG tablet Commonly known as: ADVIL Take 1 tablet (600 mg total) by  mouth every 6 (six) hours as needed.   PrePLUS 27-1 MG Tabs Take 1 tablet by mouth daily.   PrePLUS 27-1 MG Tabs Take 1 tablet by mouth daily.         Discharge home in stable condition Infant Feeding: {Baby feeding:23562} Infant Disposition:{CHL IP OB HOME WITH GLOVFI:43329} Discharge instruction: per After Visit Summary and Postpartum booklet. Activity: Advance as tolerated. Pelvic rest for 6 weeks.  Diet: {OB JJOA:41660630} Future Appointments:No future appointments. Follow up Visit:  Message sent to [facility] 9/11  Please schedule this patient for a In person postpartum visit in 6 weeks with the following provider: Any provider. Additional Postpartum F/U: None   High risk pregnancy complicated by:  no PNC Delivery mode:  Vaginal, Spontaneous Anticipated Birth Control:  Unsure   09/30/2022 Florence Bing, MD

## 2022-09-28 NOTE — Lactation Note (Signed)
This note was copied from a baby's chart. Lactation Consultation Note  Patient Name: Monique Wilkins [id number] Date: 09/28/2022 Age:33 hours Reason for consult: Initial assessment  Mother states she breastfed her last 2 children.  Baby latched with intermittent swallows in cradle hold. Feed on demand with cues.  Goal 8-12+ times per day after first 24 hrs.  Parents do not have questions at this time but state baby breastfed on the way to the hospital (born at home). and has been latching with ease since. Suggest calling for help as needed.   Maternal Data Has patient been taught Hand Expression?: Yes Does the patient have breastfeeding experience prior to this delivery?: Yes How long did the patient breastfeed?: 10 mos. & 3 weeks  Feeding Mother's Current Feeding Choice: Breast Milk  LATCH Score Latch: Grasps breast easily, tongue down, lips flanged, rhythmical sucking.  Audible Swallowing: A few with stimulation  Type of Nipple: Everted at rest and after stimulation  Comfort (Breast/Nipple): Soft / non-tender  Hold (Positioning): No assistance needed to correctly position infant at breast.  LATCH Score: 9  Interventions Interventions: Breast feeding basics reviewed;Education;LC Services brochure Consult Status Consult Status: Follow-up Date: 09/29/22 Follow-up type: In-patient    Dahlia Byes [employer] 09/28/2022, 7:50 AM

## 2022-09-28 NOTE — H&P (Signed)
LABOR AND DELIVERY ADMISSION HISTORY AND PHYSICAL NOTE  Monique Wilkins is a 32 y.o. female [phone] with IUP at Unknown presenting via EMS following precipitous delivery at home.   Patient had no PNC this pregnancy. States she did not know she was pregnant. Delivered at home on toilet. Placenta delivered spontaneously after. Then was transported via EMS to hospital. She noted some bleeding at delivery. EMS stated minimal on the way to the hospital.  Pregnancy complications:  Patient Active Problem List   Diagnosis Date Noted   VBAC (vaginal birth after Cesarean) 09/28/2022   Normal labor and delivery 09/28/2022   History of gestational hypertension 07/22/2015   No prenatal care in current pregnancy 07/20/2015   Group B Streptococcus carrier, antepartum 07/19/2015   Late prenatal care affecting pregnancy, antepartum 07/18/2015    Past Medical History: Past Medical History:  Diagnosis Date   Asthma     Past Surgical History: Past Surgical History:  Procedure Laterality Date   CESAREAN SECTION     CESAREAN SECTION N/A 07/20/2015   Procedure: CESAREAN SECTION;  Surgeon: [place] Bing, MD;  Location: [facility] BIRTHING SUITES;  Service: Obstetrics;  Laterality: N/A;    Obstetrical History: OB History     Gravida  5   Para  3   Term  3   Preterm      AB  1   Living  3      SAB  1   IAB      Ectopic      Multiple  0   Live Births  3           Social History: Social History   Socioeconomic History   Marital status: Married    Spouse name: Not on file   Number of children: Not on file   Years of education: Not on file   Highest education level: Not on file  Occupational History   Not on file  Tobacco Use   Smoking status: Never   Smokeless tobacco: Never  Substance and Sexual Activity   Alcohol use: No   Drug use: No   Sexual activity: Yes    Birth control/protection: None  Other Topics Concern   Not on file  Social History Narrative   Not  on file   Social Determinants of Health   Financial Resource Strain: Not on file  Food Insecurity: Not on file  Transportation Needs: Not on file  Physical Activity: Not on file  Stress: Not on file  Social Connections: Not on file    Family History: Family History  Problem Relation Age of Onset   Alcohol abuse Neg Hx     Allergies: No Known Allergies  No medications prior to admission.     Review of Systems  All systems reviewed and negative except as stated in HPI  Physical Exam There were no vitals taken for this visit.  Physical Exam Constitutional:      General: She is in acute distress.     Appearance: She is obese.  Cardiovascular:     Rate and Rhythm: Regular rhythm. Tachycardia present.  Pulmonary:     Effort: Pulmonary effort is normal.  Abdominal:     Comments: Gravid  Neurological:     Mental Status: Mental status is at baseline.  Psychiatric:        Mood and Affect: Mood normal.      Prenatal labs: ABO, Rh: --/--/PENDING (09/11 4132) Antibody: PENDING (09/11 0420) Rubella:   RPR:  HBsAg:    HIV:    GC/Chlamydia:  Neisseria Gonorrhea  Date Value Ref Range Status  07/18/2015 Negative  Final    Comment:    Normal Reference Range - Negative   Chlamydia  Date Value Ref Range Status  07/18/2015 Negative  Final    Comment:    Normal Reference Range - Negative   GBS:      Prenatal Transfer Tool  No PNC.  Results for orders placed or performed during the hospital encounter of 09/28/22 (from the past 24 hour(s))  Type and screen [facility]   Collection Time: 09/28/22  4:20 AM  Result Value Ref Range   ABO/RH(D) PENDING    Antibody Screen PENDING    Sample Expiration      10/01/2022,2359 Performed at [facility], [address], [place], [place] [address]   CBC   Collection Time: 09/28/22  4:33 AM  Result Value Ref Range   WBC 17.5 (H) 4.0 - 10.5 K/uL   RBC 4.23 3.87 - 5.11 MIL/uL   Hemoglobin 10.4  (L) 12.0 - 15.0 g/dL   HCT 60.4 (L) 54.0 - 98.1 %   MCV 76.6 (L) 80.0 - 100.0 fL   MCH 24.6 (L) 26.0 - 34.0 pg   MCHC 32.1 30.0 - 36.0 g/dL   RDW 19.1 (H) 47.8 - 29.5 %   Platelets 196 150 - 400 K/uL   nRBC 0.0 0.0 - 0.2 %    Assessment: Yoshi Sanchezlopez is a 33 y.o. [id number] at Unknown here after precipitous home delivery. Please see delivery note for repair. Will obtain prenatal labs.  Joanne Gavel, MD [facility] Fellow Center for [facility], [facility]  09/28/2022, 5:21 AM

## 2022-09-29 LAB — CBC
HCT: 26.6 % — ABNORMAL LOW (ref 36.0–46.0)
Hemoglobin: 8.4 g/dL — ABNORMAL LOW (ref 12.0–15.0)
MCH: 23.5 pg — ABNORMAL LOW (ref 26.0–34.0)
MCHC: 31.6 g/dL (ref 30.0–36.0)
MCV: 74.3 fL — ABNORMAL LOW (ref 80.0–100.0)
Platelets: 197 10*3/uL (ref 150–400)
RBC: 3.58 MIL/uL — ABNORMAL LOW (ref 3.87–5.11)
RDW: 15.9 % — ABNORMAL HIGH (ref 11.5–15.5)
WBC: 17.5 10*3/uL — ABNORMAL HIGH (ref 4.0–10.5)
nRBC: 0 % (ref 0.0–0.2)

## 2022-09-29 LAB — GC/CHLAMYDIA PROBE AMP (~~LOC~~) NOT AT ARMC
Chlamydia: NEGATIVE
Comment: NEGATIVE
Comment: NORMAL
Neisseria Gonorrhea: NEGATIVE

## 2022-09-29 LAB — RUBELLA SCREEN: Rubella: <0.9 {index} — ABNORMAL LOW (ref >0.99)

## 2022-09-29 MED ORDER — FERROUS FUMARATE 324 (106 FE) MG PO TABS
1.0000 | ORAL_TABLET | ORAL | Status: DC
  Administered 2022-09-29: 106 mg via ORAL
  Filled 2022-09-29: qty 1

## 2022-09-29 NOTE — Progress Notes (Signed)
Post Partum Day #1 Subjective: no complaints, up ad lib, and tolerating PO; breastfeeding going well; declines contraception; she would like her son circumcised prior to d/c> she was consented and a note placed in his chart  Objective: Blood pressure 118/79, pulse (!) 107, temperature 97.9 F (36.6 C), temperature source Oral, resp. rate 18, SpO2 100%, unknown if currently breastfeeding.  Physical Exam:  General: alert, cooperative, and no distress Lochia: appropriate Uterine Fundus: firm DVT Evaluation: No evidence of DVT seen on physical exam.  Recent Labs    09/28/22 0433 09/29/22 0554  HGB 10.4* 8.4*  HCT 32.4* 26.6*    Assessment/Plan: Still pending SW visit- will hold d/c for now OB labs rev'd> still pending GC/chlam and rubella screen Oral Fe added for postpartum anemia Circumcision today   LOS: 1 day   Monique Wilkins, CNM 09/29/2022, 8:05 AM

## 2022-09-29 NOTE — Clinical Social Work Maternal (Signed)
CLINICAL SOCIAL WORK MATERNAL/CHILD NOTE  Patient Details  Name: Monique Wilkins MRN: [id number] Date of Birth: 1990/07/04  Date:  09/29/2022  Clinical Social Worker Initiating Note:  Enos Fling Date/Time: Initiated:  09/29/22/1602     Child's Name:  Monique Wilkins   Biological Parents:  Mother, Father Monique Wilkins 06/13/89 Monique Wilkins 05-12-1977)   Need for Interpreter:  None   Reason for Referral:  Late or No Prenatal Care  , Current Substance Use/Substance Use During Pregnancy     Address:  [address] [place] [address]    Phone number:  [phone] (home)     Additional phone number:   Household Members/Support Persons (HM/SP):   Household Member/Support Person 1, Household Member/Support Person 2, Household Member/Support Person 3, Household Member/Support Person 4, Household Member/Support Person 5, Household Member/Support Person 6   HM/SP Name Relationship DOB or Age  HM/SP -1 Monique Wilkins FOB 05-12-1977  HM/SP -2 Karren Cobble son 06-12-2011  HM/SP -3 Joretta Bachelor daughter 06-03-2013  HM/SP -4 Jen Mow daughter 06-29-2016  HM/SP -5 Marcie Mowers   07-20-2015  HM/SP -6        HM/SP -7        HM/SP -8          Natural Supports (not living in the home):  Immediate Family, Spouse/significant other   Professional Supports: None   Employment: Full-time   Type of Work: [employer]   Education:  [employer] school graduate   Homebound arranged:    Surveyor, quantity Resources:  Media planner    Other Resources:      Cultural/Religious Considerations Which May Impact Care:    Strengths:  Ability to meet basic needs  , Home prepared for child     Psychotropic Medications:         Pediatrician:       Pediatrician List:   [employer] [facility] [place]    [place]      Pediatrician Fax Number:    Risk Factors/Current Problems:  Substance Use   (Limited/No prenatal  care)   Cognitive State:  Alert  , Able to Concentrate  , Insightful  , Goal Oriented  , Linear Thinking     Mood/Affect:  Interested  , Comfortable  , Relaxed  , Calm     CSW Assessment: CSW received a consult for Drug exposed newborn and Limited/No prenatal care and met MOB at bedside to complete a full psychosocial assessment. CSW entered the room, introduced herself and explained the reason for the visit. MOB presented as calm, was agreeable to consult and remained engaged throughout encounter.  CSW collected MOB's demographic information and inquired about her mental health history. MOB denied experiencing any mental health and PPD. CSW provided education regarding the baby blues period vs. perinatal mood disorders, discussed treatment and gave resources for mental health follow up if concerns arise.  CSW recommends self-evaluation during the postpartum time period using the New Mom Checklist from Postpartum Progress and encouraged MOB to contact a medical professional if symptoms are noted at any time. CSW assessed for safety with MOB SI/HI/DV;MOB denied all. MOB denied any CPS history.  CSW asked MOB does she receive support resources; MOB said no [facility] and food stamps). MOB reported having all essential items for the infant including a carseat, bassinet and crib for safe sleeping. CSW provided review of Sudden Infant Death Syndrome (SIDS)  precautions.   CSW asked MOB about the barriers she experienced with obtaining prenatal care during her pregnancy. MOB reported she didn't know that she was pregnant; she did not feel the infant's movements. MOB reported she went to use the bathroom, she pushed, and felt something big in the toilet, she called her husband for assistance and their was a baby in the toilet. MOB reported calling the ambulance and delivering the placenta once she arrived at the hospital.   CSW informed MOB due to the limited/no prenatal care during her pregnancy and MOB's  positive THC urine test 09-28-2022; they will perform a UDS and CDS on the infant. If the screenings return with positive results a report to CPS will be made; MOB was understanding. MOB reported her last use of THC was early August and she has been using [facility] for about 1 year. MOB reported using THC on a recreational basis. MOB denied the use of illicit substances.   CSW will continue to monitor the results for UDS and CDS and make a CPS report if warranted   CSW Plan/Description:  No Further Intervention Required/No Barriers to Discharge, Sudden Infant Death Syndrome (SIDS) Education, Perinatal Mood and Anxiety Disorder (PMADs) Education, Hospital Drug Screen Policy Information, Other Information/Referral to [facility], CSW Will Continue to Monitor Umbilical Cord Tissue Drug Screen Results and Make Report if Joanie Coddington, LCSW 09/29/2022, 4:08 PM

## 2022-09-30 MED ORDER — DOCUSATE SODIUM 100 MG PO CAPS: 100.0000 mg | ORAL_CAPSULE | Freq: Two times a day (BID) | ORAL | 1 refills | Status: AC | PRN

## 2022-09-30 MED ORDER — PREPLUS 27-1 MG PO TABS: 1.0000 | ORAL_TABLET | Freq: Every day | ORAL | 0 refills | Status: AC

## 2022-09-30 MED ORDER — MEASLES, MUMPS & RUBELLA VAC IJ SOLR
0.5000 mL | Freq: Once | INTRAMUSCULAR | Status: AC
  Administered 2022-09-30: 0.5 mL via SUBCUTANEOUS
  Filled 2022-09-30: qty 0.5

## 2022-09-30 MED ORDER — ACETAMINOPHEN 325 MG PO TABS: 650.0000 mg | ORAL_TABLET | ORAL | Status: AC | PRN

## 2022-09-30 MED ORDER — MEDROXYPROGESTERONE ACETATE 150 MG/ML IM SUSP
150.0000 mg | Freq: Once | INTRAMUSCULAR | Status: AC
  Administered 2022-09-30: 150 mg via INTRAMUSCULAR
  Filled 2022-09-30: qty 1

## 2022-09-30 MED ORDER — PREPLUS 27-1 MG PO TABS: 1.0000 | ORAL_TABLET | Freq: Every day | ORAL | Status: AC

## 2022-09-30 MED ORDER — FERROUS FUMARATE 324 (106 FE) MG PO TABS: 1.0000 | ORAL_TABLET | ORAL | 0 refills | Status: AC

## 2022-09-30 MED ORDER — IBUPROFEN 600 MG PO TABS: 600.0000 mg | ORAL_TABLET | Freq: Four times a day (QID) | ORAL | 0 refills | Status: AC | PRN

## 2022-09-30 NOTE — Lactation Note (Addendum)
This note was copied from a baby's chart. Lactation Consultation Note  Patient Name: Boy Liahona Deslauriers [id number] Date: 09/30/2022 Age:33 hours Reason for consult: Follow up assessment;Maternal discharge  Visited P3 Birth Parent of infant (unknown gestational age). This visit was an follow up and discharge. Upon entry, Birth Parent was resting and infant was out of the room being circumcised. Birth Parent reports breastfeeding her other children and being an experienced breastfeeder.   Birth Parent reports baby has been feeding well and has no concerns. She has also had the chance to pump and that is going well. Birth Parent has DEBP at home and anticipates discharge today.  Birth Parent reports she hasn't documented all diapers - reviewed doc flow sheets. LATCH scores ranging from 9-10, baby feeding consistently.  LC reviewed discharge education, normal newborn hunger cues, feeding baby on demand skin to skin every 2-3 hours (or sooner if baby cues), at least 8-12x in a 24 hour period.   Maternal Data Has patient been taught Hand Expression?: Yes Does the patient have breastfeeding experience prior to this delivery?: Yes How long did the patient breastfeed?: 1+ years  Feeding Mother's Current Feeding Choice: Breast Milk Nipple Type: Slow - flow  Interventions Interventions: Breast feeding basics reviewed;Skin to skin;Position options;DEBP;LC Services brochure;Guidelines for Milk Supply and Pumping Schedule Handout;CDC Guidelines for Breast Pump Cleaning  Discharge Pump: DEBP;Personal  Consult Status Consult Status: Complete Date: 09/30/22    Antionette Char 09/30/2022, 10:46 AM

## 2022-10-03 LAB — SURGICAL PATHOLOGY

## 2022-10-27 ENCOUNTER — Telehealth (HOSPITAL_COMMUNITY): Payer: Self-pay | Admitting: *Deleted

## 2022-10-27 NOTE — Telephone Encounter (Signed)
10/27/2022  Name: Monique Wilkins MRN: [id number] DOB: [birth year]  Reason for Call:  Transition of Care Hospital Discharge Call  Contact Status: Patient Contact Status: Message  Language assistant needed:          Follow-Up Questions:    Inocente Salles Postnatal Depression Scale:  In the Past 7 Days:    PHQ2-9 Depression Scale:     Discharge Follow-up:    Post-discharge interventions: NA  Lew Prout,RN  10/27/2022 1540

## 2022-11-15 ENCOUNTER — Ambulatory Visit (INDEPENDENT_AMBULATORY_CARE_PROVIDER_SITE_OTHER): Payer: Medicaid Other | Admitting: Advanced Practice Midwife

## 2022-11-15 ENCOUNTER — Encounter: Payer: Self-pay | Admitting: Advanced Practice Midwife

## 2022-11-15 DIAGNOSIS — R52 Pain, unspecified: Secondary | ICD-10-CM | POA: Diagnosis not present

## 2022-11-15 DIAGNOSIS — O9089 Other complications of the puerperium, not elsewhere classified: Secondary | ICD-10-CM

## 2022-11-15 DIAGNOSIS — M545 Low back pain, unspecified: Secondary | ICD-10-CM

## 2022-11-15 DIAGNOSIS — O34219 Maternal care for unspecified type scar from previous cesarean delivery: Secondary | ICD-10-CM

## 2022-11-15 DIAGNOSIS — G8929 Other chronic pain: Secondary | ICD-10-CM

## 2022-11-15 NOTE — Progress Notes (Signed)
Post Partum Visit Note  Monique Wilkins is a 32 y.o. G38P4004 female who presents for a postpartum visit. She is 6 weeks postpartum following a unplanned vaginal delivery after cesarean x 4 at home.  I have fully reviewed the prenatal and intrapartum course. The delivery was at unknown: 39 gestational weeks.  Anesthesia: none. Postpartum course has been good. Baby is doing well. Baby is feeding by breast. Bleeding no bleeding. Bowel function is normal. Bladder function is normal. Patient is not sexually active. Contraception method is Depo-Provera injections. Postpartum depression screening: negative.   The pregnancy intention screening data noted above was reviewed. Potential methods of contraception were discussed. The patient elected to proceed with No data recorded.   Edinburgh Postnatal Depression Scale - 11/15/22 1047       Edinburgh Postnatal Depression Scale:  In the Past 7 Days   I have been able to laugh and see the funny side of things. 0    I have looked forward with enjoyment to things. 0    I have blamed myself unnecessarily when things went wrong. 0    I have been anxious or worried for no good reason. 0    I have felt scared or panicky for no good reason. 0    Things have been getting on top of me. 1    I have been so unhappy that I have had difficulty sleeping. 0    I have felt sad or miserable. 0    I have been so unhappy that I have been crying. 0    The thought of harming myself has occurred to me. 0    Edinburgh Postnatal Depression Scale Total 1             Health Maintenance Due  Topic Date Due   Cervical Cancer Screening (HPV/Pap Cotest)  Never done   INFLUENZA VACCINE  Never done   COVID-19 Vaccine (1 - 2023-24 season) Never done    The following portions of the patient's history were reviewed and updated as appropriate: allergies, current medications, past family history, past medical history, past social history, past surgical  history, and problem list.  Review of Systems Pertinent items noted in HPI and remainder of comprehensive ROS otherwise negative.  Objective:  BP 119/80   Pulse 80   Ht 5\' 3"  (1.6 m)   Wt 285 lb 3.2 oz (129.4 kg)   Breastfeeding Yes   BMI 50.52 kg/m    VS reviewed, nursing note reviewed,  Constitutional: well developed, well nourished, no distress HEENT: normocephalic CV: normal rate Pulm/chest wall: normal effort Abdomen: soft Neuro: alert and oriented x 3 Skin: warm, dry Psych: affect normal   Assessment:   1. Postpartum care following vaginal delivery --Doing well, good support at home, bonding well with baby  2. Postpartum pain --Back pain, similar to pain after previous pregnancies, breastfeeding with large breast size contributing.  - AMB referral to rehabilitation  3. Chronic midline low back pain without sciatica  - AMB referral to rehabilitation   Plan:   Essential components of care per ACOG recommendations:  1.  Mood and well being: Patient with negative depression screening today. Reviewed local resources for support.  - Patient tobacco use? No.   - hx of drug use? No.    2. Infant care and feeding:  -Patient currently breastmilk feeding? Yes. Reviewed importance of draining breast regularly to support lactation.  -Social determinants of health (SDOH) reviewed in EPIC. No concerns  3. Sexuality, contraception and birth spacing - Patient does not want a pregnancy in the next year.   - Reviewed reproductive life planning. Reviewed contraceptive methods based on pt preferences and effectiveness.  Patient received Depo inpatient and desires to continue. Next dose in December.    4. Sleep and fatigue -Encouraged family/partner/community support of 4 hrs of uninterrupted sleep to help with mood and fatigue  5. Physical Recovery  - Discussed patients delivery and complications. She did not know about the pregnancy until she was delivering the baby and  reports delivery was painful but she is glad she and baby did well.  - Patient had a Vaginal, no problems at delivery. Patient had a 1st degree laceration. Perineal healing reviewed. Patient expressed understanding - Patient has urinary incontinence? No. - Patient is safe to resume physical and sexual activity  6.  Health Maintenance - HM due items addressed Yes - Last pap smear 1.5 years ago in [facility] per pt. Records requested. Pap smear not done at today's visit.  -Breast Cancer screening indicated? No.   7. Chronic Disease/Pregnancy Condition follow up: None  - PCP follow up  Sharen Counter, CNM [facility] [employer], [facility]

## 2022-11-24 ENCOUNTER — Ambulatory Visit
Payer: Medicaid Other | Attending: Advanced Practice Midwife | Admitting: Rehabilitative and Restorative Service Providers"

## 2022-12-19 ENCOUNTER — Ambulatory Visit: Payer: Medicaid Other | Admitting: *Deleted

## 2022-12-19 VITALS — BP 114/62 | HR 84

## 2022-12-19 DIAGNOSIS — Z3042 Encounter for surveillance of injectable contraceptive: Secondary | ICD-10-CM | POA: Diagnosis not present

## 2022-12-19 MED ORDER — MEDROXYPROGESTERONE ACETATE 150 MG/ML IM SUSP
150.0000 mg | Freq: Once | INTRAMUSCULAR | Status: AC
Start: 2022-12-19 — End: 2022-12-19
  Administered 2022-12-19: 150 mg via INTRAMUSCULAR

## 2022-12-19 MED ORDER — MEDROXYPROGESTERONE ACETATE 150 MG/ML IM SUSP
150.0000 mg | INTRAMUSCULAR | 1 refills | Status: AC
Start: 2022-12-19 — End: ?

## 2022-12-19 NOTE — Progress Notes (Signed)
Date last pap: Unknown. Last Depo-Provera: 09/30/22 in patient/ postpartum. Side Effects if any: NA. Serum HCG indicated? NA. Depo-Provera 150 mg IM given by: Montez Morita, RN in [place]. Next appointment due 03/06/23-03/20/23.

## 2023-03-06 ENCOUNTER — Ambulatory Visit: Payer: Medicaid Other
